# Patient Record
Sex: Female | Born: 1970 | Race: White | Hispanic: No | Marital: Single | Smoking: Former smoker
Health system: Southern US, Community
[De-identification: ages and names within clinical notes are randomized; demographics above are authoritative.]

## PROBLEM LIST (undated history)

## (undated) DIAGNOSIS — S83207A Unspecified tear of unspecified meniscus, current injury, left knee, initial encounter: Secondary | ICD-10-CM

## (undated) DIAGNOSIS — K449 Diaphragmatic hernia without obstruction or gangrene: Secondary | ICD-10-CM

## (undated) DIAGNOSIS — M1990 Unspecified osteoarthritis, unspecified site: Secondary | ICD-10-CM

## (undated) DIAGNOSIS — J189 Pneumonia, unspecified organism: Secondary | ICD-10-CM

## (undated) DIAGNOSIS — Z5189 Encounter for other specified aftercare: Secondary | ICD-10-CM

## (undated) DIAGNOSIS — E785 Hyperlipidemia, unspecified: Secondary | ICD-10-CM

## (undated) DIAGNOSIS — J45909 Unspecified asthma, uncomplicated: Secondary | ICD-10-CM

## (undated) DIAGNOSIS — Z973 Presence of spectacles and contact lenses: Secondary | ICD-10-CM

## (undated) DIAGNOSIS — F419 Anxiety disorder, unspecified: Secondary | ICD-10-CM

## (undated) DIAGNOSIS — N200 Calculus of kidney: Secondary | ICD-10-CM

## (undated) DIAGNOSIS — G4730 Sleep apnea, unspecified: Secondary | ICD-10-CM

## (undated) DIAGNOSIS — F32A Depression, unspecified: Secondary | ICD-10-CM

## (undated) DIAGNOSIS — Z86718 Personal history of other venous thrombosis and embolism: Secondary | ICD-10-CM

## (undated) DIAGNOSIS — Z972 Presence of dental prosthetic device (complete) (partial): Secondary | ICD-10-CM

## (undated) DIAGNOSIS — Z87442 Personal history of urinary calculi: Secondary | ICD-10-CM

## (undated) DIAGNOSIS — Z8489 Family history of other specified conditions: Secondary | ICD-10-CM

## (undated) DIAGNOSIS — Z7901 Long term (current) use of anticoagulants: Secondary | ICD-10-CM

## (undated) DIAGNOSIS — K219 Gastro-esophageal reflux disease without esophagitis: Secondary | ICD-10-CM

## (undated) DIAGNOSIS — I82409 Acute embolism and thrombosis of unspecified deep veins of unspecified lower extremity: Secondary | ICD-10-CM

## (undated) DIAGNOSIS — G4733 Obstructive sleep apnea (adult) (pediatric): Secondary | ICD-10-CM

## (undated) HISTORY — PX: COLONOSCOPY WITH ESOPHAGOGASTRODUODENOSCOPY (EGD): SHX5779

## (undated) HISTORY — PX: DILATION AND CURETTAGE OF UTERUS: SHX78

## (undated) HISTORY — PX: CYSTOSCOPY/RETROGRADE/URETEROSCOPY/STONE EXTRACTION WITH BASKET: SHX5317

## (undated) HISTORY — PX: FRACTURE SURGERY: SHX138

## (undated) HISTORY — PX: ABDOMINAL HYSTERECTOMY: SHX81

## (undated) HISTORY — PX: OTHER SURGICAL HISTORY: SHX169

## (undated) HISTORY — DX: Anxiety disorder, unspecified: F41.9

## (undated) HISTORY — DX: Gastro-esophageal reflux disease without esophagitis: K21.9

## (undated) HISTORY — PX: CYSTOSCOPY W/ URETERAL STENT PLACEMENT: SHX1429

## (undated) HISTORY — PX: AXILLARY SURGERY: SHX892

## (undated) HISTORY — PX: SHOULDER SURGERY: SHX246

## (undated) HISTORY — PX: TOTAL ABDOMINAL HYSTERECTOMY W/ BILATERAL SALPINGOOPHORECTOMY: SHX83

## (undated) HISTORY — DX: Hyperlipidemia, unspecified: E78.5

## (undated) HISTORY — PX: WRIST SURGERY: SHX841

## (undated) HISTORY — DX: Acute embolism and thrombosis of unspecified deep veins of unspecified lower extremity: I82.409

---
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
## 1998-05-18 ENCOUNTER — Emergency Department (HOSPITAL_COMMUNITY): Admission: EM | Admit: 1998-05-18 | Discharge: 1998-05-18 | Payer: Self-pay | Admitting: Emergency Medicine

## 1998-12-25 ENCOUNTER — Emergency Department (HOSPITAL_COMMUNITY): Admission: EM | Admit: 1998-12-25 | Discharge: 1998-12-25 | Payer: Self-pay | Admitting: Emergency Medicine

## 1998-12-26 ENCOUNTER — Encounter: Payer: Self-pay | Admitting: Emergency Medicine

## 1998-12-26 ENCOUNTER — Ambulatory Visit (HOSPITAL_COMMUNITY): Admission: RE | Admit: 1998-12-26 | Discharge: 1998-12-26 | Payer: Self-pay | Admitting: Emergency Medicine

## 1999-05-29 ENCOUNTER — Emergency Department (HOSPITAL_COMMUNITY): Admission: EM | Admit: 1999-05-29 | Discharge: 1999-05-29 | Payer: Self-pay

## 1999-12-30 ENCOUNTER — Emergency Department (HOSPITAL_COMMUNITY): Admission: EM | Admit: 1999-12-30 | Discharge: 1999-12-30 | Payer: Self-pay | Admitting: Emergency Medicine

## 1999-12-30 ENCOUNTER — Encounter: Payer: Self-pay | Admitting: Emergency Medicine

## 2000-01-02 ENCOUNTER — Encounter: Payer: Self-pay | Admitting: Urology

## 2000-01-02 ENCOUNTER — Ambulatory Visit (HOSPITAL_COMMUNITY): Admission: RE | Admit: 2000-01-02 | Discharge: 2000-01-02 | Payer: Self-pay | Admitting: Urology

## 2000-01-08 ENCOUNTER — Ambulatory Visit (HOSPITAL_COMMUNITY): Admission: RE | Admit: 2000-01-08 | Discharge: 2000-01-08 | Payer: Self-pay | Admitting: Urology

## 2000-01-08 ENCOUNTER — Encounter: Payer: Self-pay | Admitting: Urology

## 2000-03-07 ENCOUNTER — Emergency Department (HOSPITAL_COMMUNITY): Admission: EM | Admit: 2000-03-07 | Discharge: 2000-03-08 | Payer: Self-pay | Admitting: Emergency Medicine

## 2000-03-08 ENCOUNTER — Encounter: Payer: Self-pay | Admitting: Urology

## 2000-03-08 ENCOUNTER — Encounter: Payer: Self-pay | Admitting: Emergency Medicine

## 2000-03-08 ENCOUNTER — Encounter: Payer: Self-pay | Admitting: Anesthesiology

## 2000-03-08 ENCOUNTER — Ambulatory Visit (HOSPITAL_COMMUNITY): Admission: AD | Admit: 2000-03-08 | Discharge: 2000-03-08 | Payer: Self-pay | Admitting: Urology

## 2000-03-09 ENCOUNTER — Emergency Department (HOSPITAL_COMMUNITY): Admission: EM | Admit: 2000-03-09 | Discharge: 2000-03-09 | Payer: Self-pay

## 2000-06-11 ENCOUNTER — Ambulatory Visit (HOSPITAL_COMMUNITY): Admission: RE | Admit: 2000-06-11 | Discharge: 2000-06-11 | Payer: Self-pay | Admitting: Family Medicine

## 2000-06-11 ENCOUNTER — Encounter: Payer: Self-pay | Admitting: Family Medicine

## 2000-09-13 ENCOUNTER — Encounter (INDEPENDENT_AMBULATORY_CARE_PROVIDER_SITE_OTHER): Payer: Self-pay | Admitting: Specialist

## 2000-09-13 ENCOUNTER — Ambulatory Visit (HOSPITAL_COMMUNITY): Admission: RE | Admit: 2000-09-13 | Discharge: 2000-09-13 | Payer: Self-pay

## 2001-03-17 ENCOUNTER — Emergency Department (HOSPITAL_COMMUNITY): Admission: EM | Admit: 2001-03-17 | Discharge: 2001-03-17 | Payer: Self-pay | Admitting: Emergency Medicine

## 2001-12-05 ENCOUNTER — Emergency Department (HOSPITAL_COMMUNITY): Admission: EM | Admit: 2001-12-05 | Discharge: 2001-12-05 | Payer: Self-pay | Admitting: Emergency Medicine

## 2001-12-09 ENCOUNTER — Ambulatory Visit (HOSPITAL_COMMUNITY): Admission: RE | Admit: 2001-12-09 | Discharge: 2001-12-09 | Payer: Self-pay | Admitting: Urology

## 2001-12-11 ENCOUNTER — Encounter: Payer: Self-pay | Admitting: Urology

## 2001-12-11 ENCOUNTER — Emergency Department (HOSPITAL_COMMUNITY): Admission: EM | Admit: 2001-12-11 | Discharge: 2001-12-11 | Payer: Self-pay

## 2001-12-11 ENCOUNTER — Encounter: Payer: Self-pay | Admitting: Emergency Medicine

## 2001-12-11 ENCOUNTER — Ambulatory Visit (HOSPITAL_COMMUNITY): Admission: RE | Admit: 2001-12-11 | Discharge: 2001-12-11 | Payer: Self-pay | Admitting: Urology

## 2002-04-20 ENCOUNTER — Encounter: Payer: Self-pay | Admitting: Urology

## 2002-04-20 ENCOUNTER — Encounter: Admission: RE | Admit: 2002-04-20 | Discharge: 2002-04-20 | Payer: Self-pay | Admitting: Urology

## 2002-04-22 ENCOUNTER — Encounter: Payer: Self-pay | Admitting: Family Medicine

## 2002-04-22 ENCOUNTER — Ambulatory Visit (HOSPITAL_COMMUNITY): Admission: RE | Admit: 2002-04-22 | Discharge: 2002-04-22 | Payer: Self-pay | Admitting: Family Medicine

## 2002-05-27 ENCOUNTER — Other Ambulatory Visit: Admission: RE | Admit: 2002-05-27 | Discharge: 2002-05-27 | Payer: Self-pay | Admitting: Family Medicine

## 2002-07-13 ENCOUNTER — Ambulatory Visit (HOSPITAL_COMMUNITY): Admission: RE | Admit: 2002-07-13 | Discharge: 2002-07-13 | Payer: Self-pay | Admitting: Obstetrics & Gynecology

## 2002-12-23 ENCOUNTER — Emergency Department (HOSPITAL_COMMUNITY): Admission: EM | Admit: 2002-12-23 | Discharge: 2002-12-23 | Payer: Self-pay | Admitting: Emergency Medicine

## 2003-04-05 ENCOUNTER — Emergency Department (HOSPITAL_COMMUNITY): Admission: EM | Admit: 2003-04-05 | Discharge: 2003-04-05 | Payer: Self-pay | Admitting: Emergency Medicine

## 2003-04-05 ENCOUNTER — Encounter: Payer: Self-pay | Admitting: Emergency Medicine

## 2003-06-05 ENCOUNTER — Emergency Department (HOSPITAL_COMMUNITY): Admission: EM | Admit: 2003-06-05 | Discharge: 2003-06-06 | Payer: Self-pay | Admitting: Emergency Medicine

## 2003-06-11 ENCOUNTER — Encounter: Payer: Self-pay | Admitting: Family Medicine

## 2003-06-11 ENCOUNTER — Encounter: Admission: RE | Admit: 2003-06-11 | Discharge: 2003-06-11 | Payer: Self-pay | Admitting: Family Medicine

## 2003-06-23 ENCOUNTER — Encounter: Payer: Self-pay | Admitting: Family Medicine

## 2003-06-23 ENCOUNTER — Encounter: Admission: RE | Admit: 2003-06-23 | Discharge: 2003-06-23 | Payer: Self-pay | Admitting: Family Medicine

## 2003-08-11 ENCOUNTER — Ambulatory Visit (HOSPITAL_COMMUNITY): Admission: RE | Admit: 2003-08-11 | Discharge: 2003-08-11 | Payer: Self-pay | Admitting: Obstetrics and Gynecology

## 2003-08-11 ENCOUNTER — Encounter (INDEPENDENT_AMBULATORY_CARE_PROVIDER_SITE_OTHER): Payer: Self-pay

## 2003-10-26 ENCOUNTER — Encounter (INDEPENDENT_AMBULATORY_CARE_PROVIDER_SITE_OTHER): Payer: Self-pay | Admitting: Specialist

## 2003-10-26 ENCOUNTER — Inpatient Hospital Stay (HOSPITAL_COMMUNITY): Admission: RE | Admit: 2003-10-26 | Discharge: 2003-10-29 | Payer: Self-pay | Admitting: Obstetrics and Gynecology

## 2004-03-06 ENCOUNTER — Emergency Department (HOSPITAL_COMMUNITY): Admission: EM | Admit: 2004-03-06 | Discharge: 2004-03-06 | Payer: Self-pay | Admitting: Family Medicine

## 2005-03-09 ENCOUNTER — Encounter: Admission: RE | Admit: 2005-03-09 | Discharge: 2005-03-09 | Payer: Self-pay | Admitting: Family Medicine

## 2005-03-14 ENCOUNTER — Encounter: Admission: RE | Admit: 2005-03-14 | Discharge: 2005-03-14 | Payer: Self-pay | Admitting: Family Medicine

## 2005-05-28 IMAGING — US US ABDOMEN COMPLETE
1 series · 14 of 25 positions shown · non-contrast
Comparison: Abdomen CT dated 12/11/2001.

CLINICAL DATA: Right upper quadrant abdominal pain and tenderness.

COMPLETE ABDOMEN ULTRASOUND

[Series 1: unknown · 0.34mm/px · 14 of 65 slices shown]
[im 1/65]
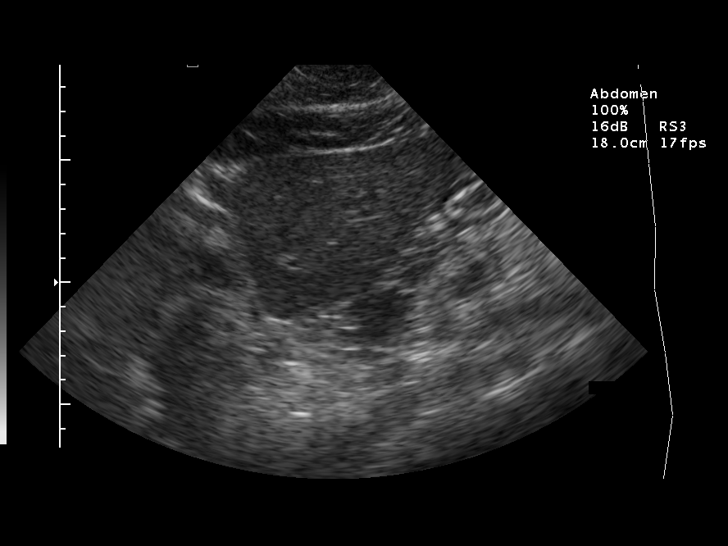
[im 6/65]
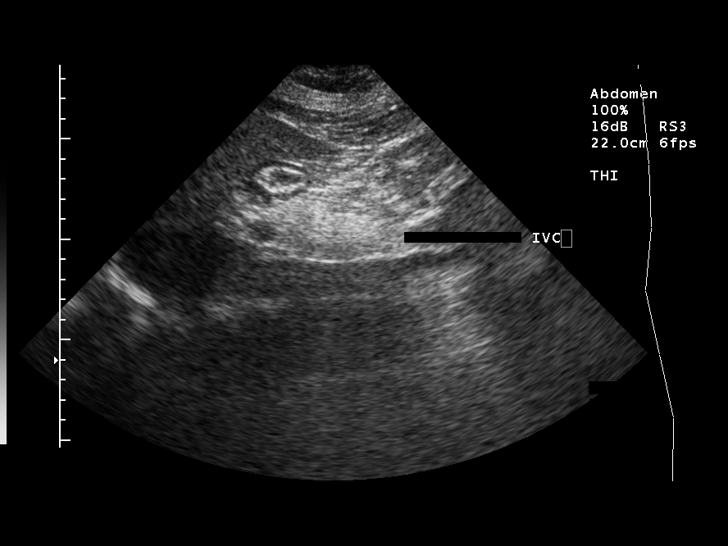
[im 11/65]
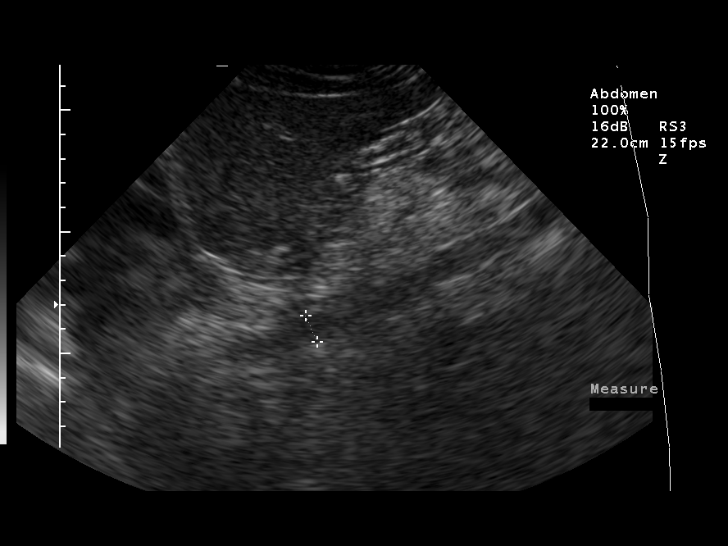
[im 17/65]
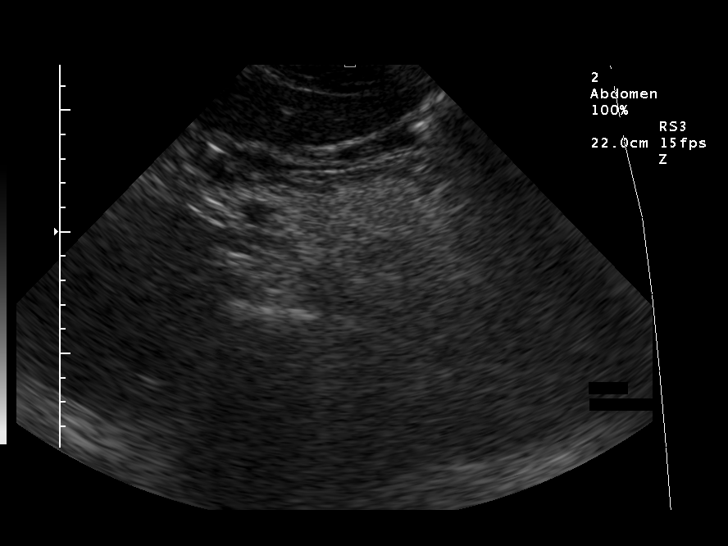
[im 22/65]
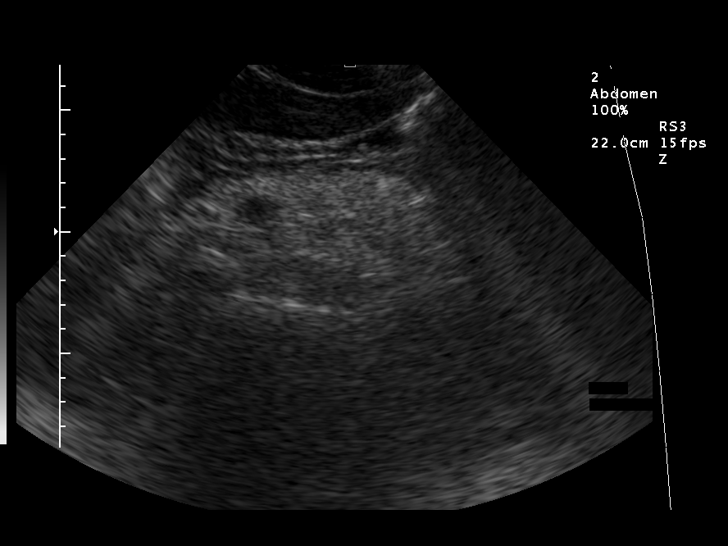
[im 25/65]
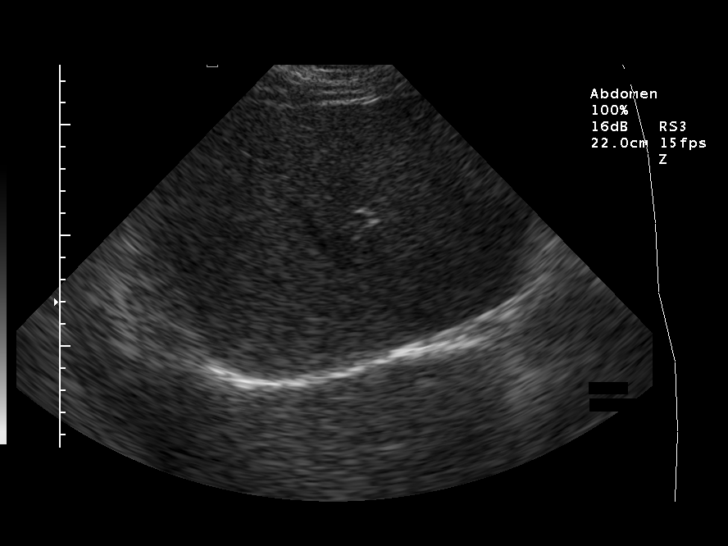
[im 30/65]
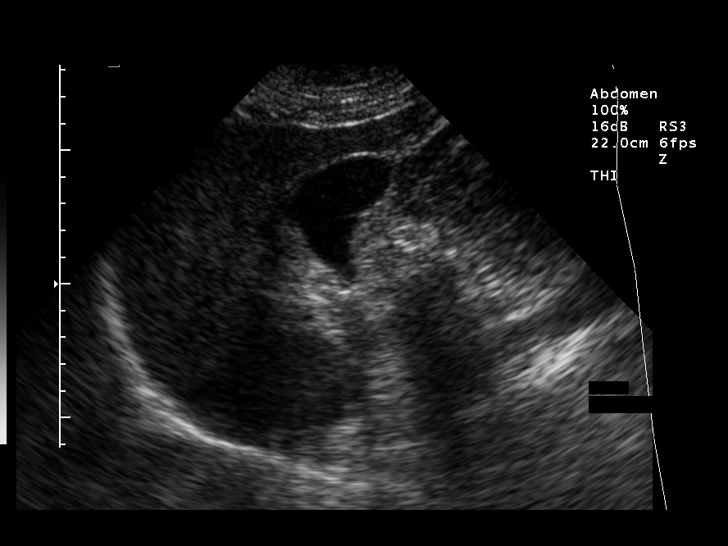
[im 35/65]
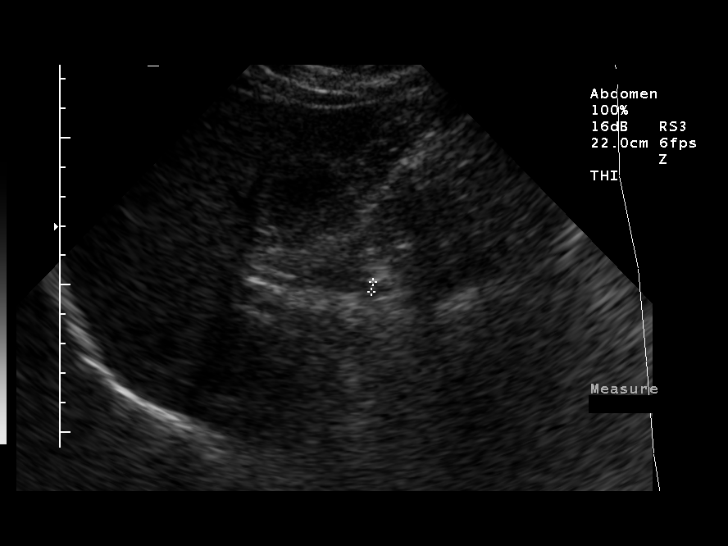
[im 41/65]
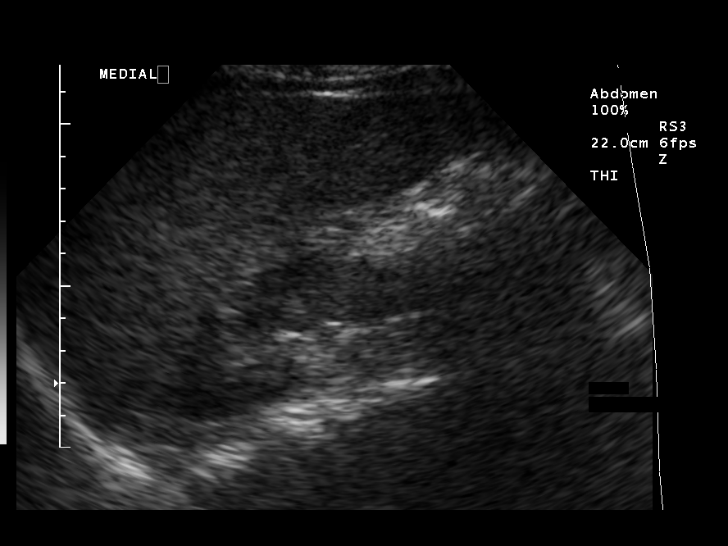
[im 43/65]
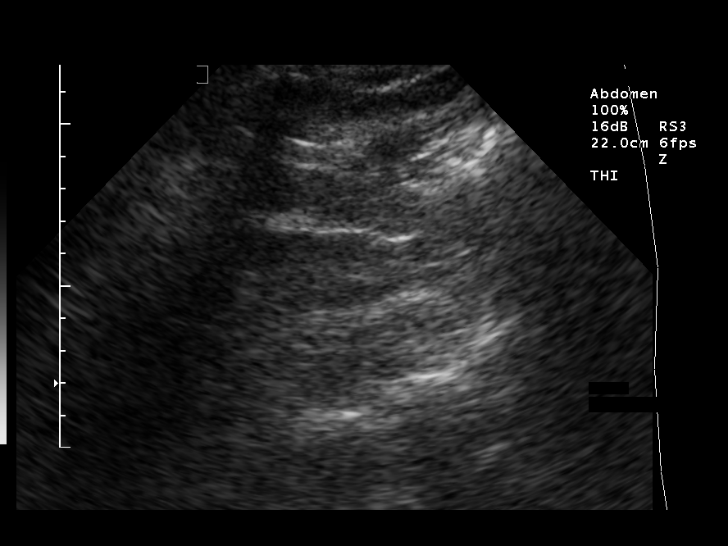
[im 49/65]
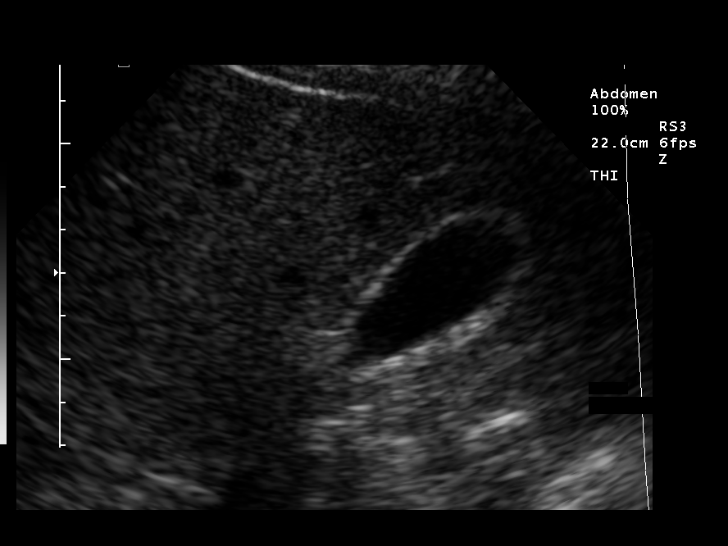
[im 54/65]
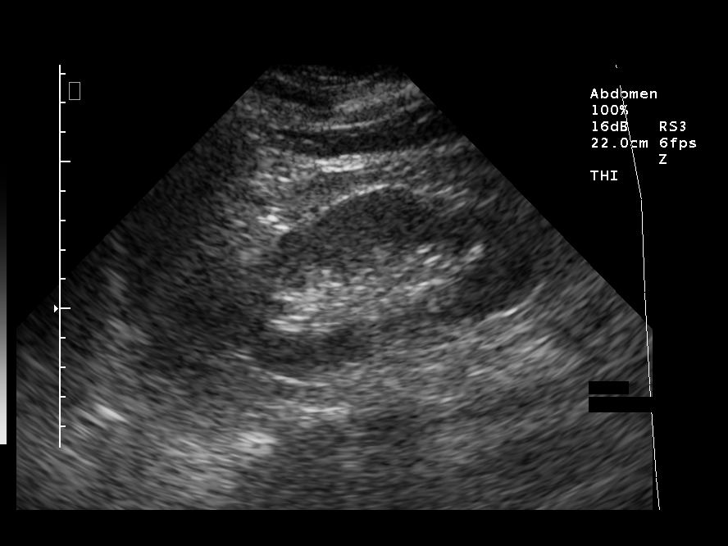
[im 59/65]
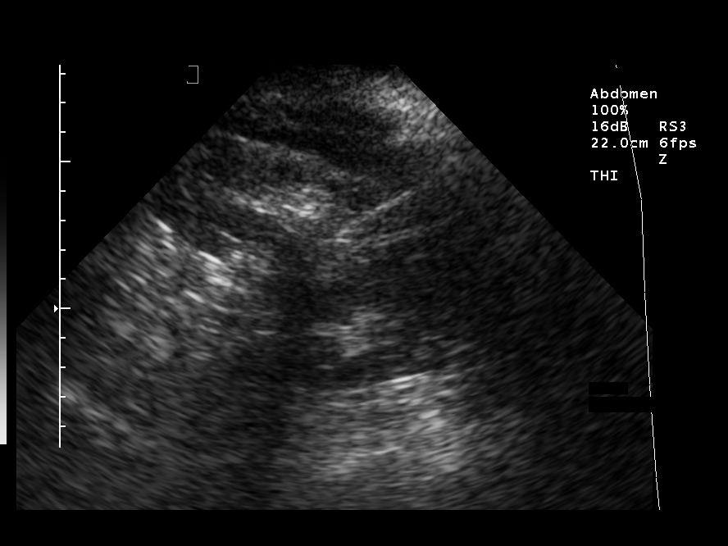
[im 65/65]
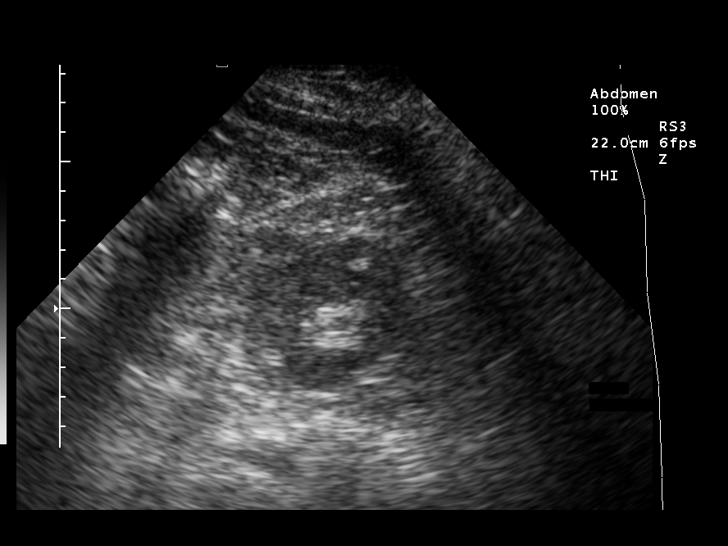

[14 of 25 positions shown; findings below may reference images not displayed]

The gallbladder, liver, spleen, pancreas, kidneys, abdominal aorta and inferior
vena cava have normal appearances. No gallstones, biliary ductal dilatation or
free peritoneal fluid.  The common duct measures 4.5 mm in maximum diameter.

IMPRESSION

Normal examination.

## 2005-09-11 ENCOUNTER — Encounter: Admission: RE | Admit: 2005-09-11 | Discharge: 2005-09-11 | Payer: Self-pay | Admitting: Obstetrics and Gynecology

## 2008-10-15 HISTORY — PX: SHOULDER ARTHROSCOPY: SHX128

## 2009-06-28 ENCOUNTER — Ambulatory Visit (HOSPITAL_BASED_OUTPATIENT_CLINIC_OR_DEPARTMENT_OTHER): Admission: RE | Admit: 2009-06-28 | Discharge: 2009-06-28 | Payer: Self-pay | Admitting: Family Medicine

## 2009-07-02 ENCOUNTER — Ambulatory Visit: Payer: Self-pay | Admitting: Internal Medicine

## 2010-10-30 ENCOUNTER — Emergency Department (HOSPITAL_COMMUNITY)
Admission: EM | Admit: 2010-10-30 | Discharge: 2010-10-31 | Payer: Self-pay | Source: Home / Self Care | Admitting: Emergency Medicine

## 2010-10-30 ENCOUNTER — Emergency Department (HOSPITAL_COMMUNITY)
Admission: EM | Admit: 2010-10-30 | Discharge: 2010-10-30 | Payer: Self-pay | Source: Home / Self Care | Admitting: Emergency Medicine

## 2010-10-30 ENCOUNTER — Encounter (INDEPENDENT_AMBULATORY_CARE_PROVIDER_SITE_OTHER): Payer: Self-pay | Admitting: Emergency Medicine

## 2010-11-01 LAB — POCT I-STAT, CHEM 8
BUN: 8 mg/dL (ref 6–23)
Calcium, Ion: 1.21 mmol/L (ref 1.12–1.32)
Chloride: 106 mEq/L (ref 96–112)
Creatinine, Ser: 0.9 mg/dL (ref 0.4–1.2)
Glucose, Bld: 98 mg/dL (ref 70–99)
HCT: 42 % (ref 36.0–46.0)
Hemoglobin: 14.3 g/dL (ref 12.0–15.0)
Potassium: 4.3 mEq/L (ref 3.5–5.1)
Sodium: 140 mEq/L (ref 135–145)
TCO2: 26 mmol/L (ref 0–100)

## 2010-11-15 ENCOUNTER — Other Ambulatory Visit: Payer: Self-pay | Admitting: Family Medicine

## 2010-11-15 DIAGNOSIS — Z1239 Encounter for other screening for malignant neoplasm of breast: Secondary | ICD-10-CM

## 2010-12-01 ENCOUNTER — Ambulatory Visit
Admission: RE | Admit: 2010-12-01 | Discharge: 2010-12-01 | Disposition: A | Discharge status: Home / Self Care | Payer: BC Managed Care – PPO | Source: Ambulatory Visit | Attending: Family Medicine | Admitting: Family Medicine

## 2010-12-01 DIAGNOSIS — Z1239 Encounter for other screening for malignant neoplasm of breast: Secondary | ICD-10-CM

## 2010-12-04 ENCOUNTER — Ambulatory Visit: Payer: Self-pay

## 2011-03-02 NOTE — Op Note (Signed)
**Note [name]-Identified via Obfuscation** [facility]  Patient:    [name], [name]                         MRN: [id number] Proc. Date: 09/13/00 Attending:  Zigmund Daniel, M.D.                           Operative Report  PREOPERATIVE DIAGNOSIS:  Hidradenitis suppurativa, left axilla.  POSTOPERATIVE DIAGNOSIS:  Hidradenitis suppurativa, left axilla.  OPERATION:  Excision of left axillary stem.  SURGEON:  Zigmund Daniel, M.D.  ANESTHESIA:  General.  DESCRIPTION OF PROCEDURE:  After the patient was adequately monitored and anesthetized, and after routine preparation and draping of the skin and surrounding areas of the left axilla and arm, I marked an elliptical skin including all of the noted chronic abscessed scars in the left axilla.  This amounted to an area of about 5 cm wide and 10 cm in length transversely across the axilla.  I incised the area with a scalpel and then achieved hemostasis with cautery. I incised the full thickness of skin and some underlying subcutaneous tissue, including all scars and chronic abscesses.  I then undermined the skin in all directions for several centimeters so as to provide relaxation for closure.  I got ______ spaced.  I then placed a 7 mm Jackson-Pratt drain through a separate stab incision, secured it with 2-0 silk.  I closed the wound using interrupted 3-0 Vicryl sutures for the subcutaneous tissues.  Staples for the skin.  I applied a bulky, somewhat compressive bandage.  The patient tolerated the operation well. DD:  09/13/00 TD:  09/13/00 Job: [id number]

## 2011-03-02 NOTE — Op Note (Signed)
[name], [name]                          ACCOUNT NO.:  [id number]   MEDICAL RECORD NO.:  [id number]                   PATIENT TYPE:  INP   LOCATION:  9302                                 FACILITY:  [facility]   PHYSICIAN:  [name], M.D.                DATE OF BIRTH:  28-Sep-1971   DATE OF PROCEDURE:  10/26/2003  DATE OF DISCHARGE:                                 OPERATIVE REPORT   PREOPERATIVE DIAGNOSES:  1. Complex endometrial hyperplasia.  2. Dysmenorrhea.  3. Pelvic pain.   POSTOPERATIVE DIAGNOSES:  1. Complex endometrial hyperplasia.  2. Dysmenorrhea.  3. Pelvic pain.   PROCEDURE:  Total abdominal hysterectomy and bilateral salpingo-  oophorectomy.   SURGEON:  [name], M.D.   ASSISTANT:  [name]. [name], M.D.   ANESTHESIA:  General endotracheal anesthesia.   COMPLICATIONS:  None.   ESTIMATED BLOOD LOSS:  150 mL.   URINE OUTPUT:  100 mL clear urine.   FINDINGS:  Marked centripetal obesity, normal-appearing uterus, tubes and  ovaries with adhesions of the left ovary and fallopian tube to the pelvic  sidewall and adhesions of the colon and reactive to the left ovary.   INDICATIONS FOR PROCEDURE:  This is a 40 year old gravida 0 white female who  was recently diagnosed with complex endometrial hyperplasia without atypia  on D&C performed in October of 2004.  At the same time, she also underwent  diagnostic laparoscopy for chronic pelvic pain and at that time was noted to  have some pelvic sidewall adhesions of the left ovary which were difficult  to take down secondary to their dense nature.  The patient has a long  history of oligomenorrhea and has had a normal prolactin, TSH and glucose  insulin ratio.  She has been on cyclic progestin since her surgery in  October but desires definitive treatment with hysterectomy for complex  hyperplasia as she is unable to tolerate progestin secondary to the side  effects and she desires no childbearing in the  future.  Given her history of  pelvic pain, she also desires BSO.  She has been taking ibuprofen and  narcotics to control her dysmenorrhea.  Prior to the procedure, the risks of  the procedure were reviewed with the patient and informed consent were  obtained.  Discussed the risks of hemorrhage requiring transfusion,  infection requiring prolonged hospitalization, injury to bowel, bladder, or  other intra-abdominal organs requiring further surgery, possibility of  fistula formation, anesthetic related complications including death, deep  venous thrombosis, and we discussed the need for hormone replacement therapy  given that she will be without any ovarian hormone production.  I also  discussed the risk of decreased sexual function secondary to lack of ovarian  estrogen and __________ production.  The patient voiced understanding of all  these risks and agrees to proceed.  Consent was obtained prior to proceeding  to the OR.   DESCRIPTION  OF PROCEDURE:  The patient was taken to the operating room and  given general anesthesia, prepped and draped in the usual sterile fashion  and placed in the supine position.  A Foley catheter was placed.  A  Pfannenstiel skin incision was made with the scalpel.  This was carried down  sharply to the fascia.  The subcutaneous fat was noted to be approximately 4  cm in depth.  The fascia was then incised in the midline and the fascial  incision was extended laterally with the Mayo scissors.  The superior and  inferior aspects of the fascial incision were grasped with Kocher clamps,  elevated and the underlying rectus muscles dissected off with sharp and  blunt dissection.  The muscles were then severed in the midline.  The  peritoneum was identified, tented up with the hemostats and then sharply  with Metzenbaum scissors.  This incision was extended superiorly and  inferiorly with good visualization of the bladder.  At this point, the  Balfour retractor  was placed and there was very little mobility of the  rectus muscles. We were unable to obtain adequate visualization, therefore,  the retractor was removed.  The skin incision was then extended on both  sides and the fascial incision was extended as well.  Again, there was great  difficulty in separating the rectus muscles, secondary to both intra-  abdominal fat and the size of the muscles, therefore, the rectus muscles  were then transected approximately 1 cm laterally on both sides, free from  the inferior epigastric vessels and after this was done, we were able to  place the Balfour retractor and adequate retract the muscles laterally.  At  this point, the bowel was packed away with moist laparotomy sponges.  There  were adhesions noted of the colon to the left fallopian tube and ovary.  These were taken down with very sharp meticulous dissection with Metzenbaum  scissors until the bowel was able to be packed away adequately.  At this  point, two curved Kelly clamps were then placed on the cornua and used for  retraction.  The round ligament on the left side was then suture ligated and  transected and hemostasis was visualized.  The broad ligament was then  opened up and careful blunt dissection was used to identify the ureter.  The  ureter was noted to be coursing well beneath the infundibulopelvic ligament;  however, the ovary was stuck to the back side of the broad ligament as well  as part of the uterus.  The ovary was then carefully taken off the uterus  and the broad ligament with sharp dissection.  The infundibulopelvic  ligament was then ligated well above the area of the ureter.  The utero-  ovarian ligament was then ligated as well as it was very difficult to see  secondary to the patient's size.  Once the utero-ovarian and the  infundibulopelvic ligaments were ligated, the ovary and tube were removed to facilitate further visualization.  At this point, attention was then  turned  to the patient's right side where the same procedure was carried out in a  similar fashion.  Again, the round ligament was transected and ligated.  The  broad ligament was opened up and the ureter was found coursing well beneath  the infundibulopelvic ligament.  The infundibulopelvic ligament was then  transected and ligated with LigaSure.  The utero-ovarian ligament was then  ligated with the LigaSure and the ovary and tube were removed.  At this  point, the bladder flap was created using sharp dissection.  There was a  great amount of preperitoneal fat over the bladder and it was very difficult  to visualize.  The bladder was taken down meticulously with both sharp and  blunt dissection.  Once this was achieved, the uterine arteries were then  skeletonized bilaterally.  The uterine arteries were then ligated and  transected and were hemostatic.  At this point, the cardinal ligaments were  then ligated and transected as well and the site was hemostatic.  At this  point, the uterosacral ligaments were palpated.  They were then clamped with  Heaney clamps, transected and suture ligated and were hemostatic.  At this  point, the vaginal cuff angles were grasped with Heaney clamps just beneath  the palpable level of the cervix.  The cuff angle was then transected just  beneath the cervix and was secured with a figure-of-eight suture of 0  Vicryl.  At this point, the uterus and cervix were amputated with the curved  hysterectomy scissors.  The edges of the vaginal cuff were grasped with  Allis clamps.  There was some bleeding noted from the left angle.  This was  carefully secured and ligated with a Vicryl suture and was made hemostatic.  At this point, the vaginal cuff was closed using figure-of-eight sutures of  Vicryl and was hemostatic.  At this point, all pedicles were carefully  inspected.  There was no evidence of any bleeding noted.  The pelvis was  then irrigated copiously with  warm normal saline. After irrigation was  completed, pedicles were again reinspected and there was no active bleeding  noted.  The area beneath the bladder was inspected.  There was no bleeding  noted.  At this point, all laparotomy sponges and instruments were removed  from the patient's abdomen.  The bowel was unpacked.  The Balfour retractor  was removed.  The bowel was carefully inspected.  There was no evidence of  any undue trauma to the bowel.  The muscles that had been transected  previously were carefully inspected.  There was no active bleeding noted.  All sponge, lap, instrument and needle counts correct at this point and the  fascia was closed with a running PDS suture.  Great care was taken to insure  that both layers of the fascia were incorporated into the fascial suture at  closure and once closed, the fascia was hemostatic.  At this point, the  incision was then again irrigated. There was no evidence of any active bleeding in the subcutaneous space, given the significant depth of the  subcutaneous space,  four interrupted Vicryl sutures were placed to  facilitate wound closure followed by closure of the skin with staples.   The patient tolerated the procedure well.  Sponge, lap, instrument and  needle counts correct x 2.  She was taken to the recovery room awake and in  stable condition.  There were no complications.   Total time for procedure was two and a half hours secondary to patient's  marked obesity.                                               [name], M.D.    JW/MEDQ  D:  10/26/2003  T:  10/26/2003  Job:  [id number]

## 2011-03-02 NOTE — Op Note (Signed)
NAMEFOREST, PRUDEN                          ACCOUNT NO.:  [id number]   MEDICAL RECORD NO.:  [id number]                   PATIENT TYPE:  AMB   LOCATION:  SDC                                  FACILITY:  [facility]   PHYSICIAN:  Richardean Sale, M.D.                DATE OF BIRTH:  1970/12/26   DATE OF PROCEDURE:  08/11/2003  DATE OF DISCHARGE:                                 OPERATIVE REPORT   PREOPERATIVE DIAGNOSES:  1. Pelvic pain.  2. Oligomenorrhea with thickened endometrium on ultrasound.   POSTOPERATIVE DIAGNOSES:  1. Pelvic pain.  2. Oligomenorrhea with thickened endometrium on ultrasound.   PROCEDURE:  Dilatation and curettage and laparoscopy with lysis of  adhesions.   SURGEON:  Richardean Sale, M.D.   ASSISTANT:  Lenoard Aden, M.D.   ANESTHESIA:  General.   COMPLICATIONS:  None.   ESTIMATED BLOOD LOSS:  Minimal.   FINDINGS:  Normal-appearing cervix, uterus, fallopian tubes and ovaries with  adhesions of the left ovary to the pelvic sidewall; normal-appearing  appendix; normal-appearing liver; no evidence of endometriosis.   INDICATION:  This is a 40 year old gravida 0 white female with a two-month  history of pelvic pain, predominantly left-sided, who has a history of  endometriosis on laparoscopy one year ago.  The patient had undergone an  ultrasound to evaluate her pelvic pain, at which time she was noted to have  a prominent endometrial stripe with an inhomogeneous appearance.  The  patient does have a history of oligomenorrhea with occasional heavy menses.  The patient has declined empiric treatment with either oral contraceptives  or Lupron.   PROCEDURE:  After the risks and benefits of the procedure were reviewed with  the patient and informed consent was obtained, she was then taken to the  operating room where she was given general anesthesia and was prepped and  draped in the usual sterile fashion in the dorsal lithotomy position.  Bimanual exam  revealed an anteverted uterus which was small, mobile and  nontender.  A sterile speculum was placed in the vagina.  The cervix was  grasped with single-tooth tenaculum and the cervix was then dilated with the  [facility] dilators.  A sharp D&C was performed with a moderate amount of tissue  removed and this was sent to pathology.  The Hulka tenaculum was then placed  into the cervix and the speculum and single-tooth tenaculum were removed.  Attention was then turned to the patient's abdomen, where a 10-mm skin  incision was then made just beneath the umbilicus.  This was carried down  sharply to the fascia.  The fascia was identified and entered sharply with  the Mayo scissors.  This incision was then extended laterally and the  fascial incision was secured with a pursestring Vicryl suture.  The balloon  Hasson trocar was then introduced through the incision, the balloon was  inflated and CO2 gas was  allowed to insufflate the abdomen.  The camera was  then introduced and confirmed placement in the abdomen.  The omentum was  inspected and there was no evidence of any injury.  The bowel was inspected;  there was no evidence of any injury with trocar insertion.  The pelvis was  then inspected and a second 5-mm skin incision was then made 2 cm above the  symphysis pubis in the midline.  Through this, the 5-mm trocar and sleeve  were introduced under direct visualization.  A blunt probe was then used to  retract the bowel and the pelvis was examined with the findings noted above.  There were some adhesions involving the left ovary to the sidewall,  specifically over the area of the ureter.  The ureter was identified and  seemed to be coursing just beneath the ovary.  There was a small plane of  adhesions that were not directly over the ureter and therefore, a third skin  incision was then made in the patient's left lower quadrant, free from any  vessels, and the 5-mm trocar and sleeve were introduced  directly.  An  atraumatic grasper was then used to position the ovary and the laparoscopy  scissors were then used to carefully lyse these adhesions, keeping well-away  from the ureter.  Once adhesiolysis had been performed, the area was  inspected and there was no evidence of any bleeding from the sidewall; there  was, however, a scant bit of bleeding from the surface of the ovary  secondary to manipulation of the ovary; this was cauterized with the  Kleppinger cautery and was hemostatic.  The pelvis was then irrigated with  saline and a syringe was used to remove any fluid.  All surgical sites were  then inspected and were hemostatic.  At this point, the instruments were  removed from the patient's abdomen; first the 5-mm trocars were removed  under direct visualization and CO2 gas was allowed to escape.  There was no  evidence of any bleeding from the incision sites.  Once the remaining  portion of CO2 gas had been released from the abdomen, the balloon Hasson  was removed.  A finger was placed into the fascial incision; there was no  evidence of any bowel in the fascial site and the fascial incision was  closed with the already-placed pursestring suture.  A second Vicryl suture  was then used to close the remaining subcutaneous space, followed by closure  of the skin with a 4-0 Vicryl suture.  The 5-mm sites were closed with  Dermabond.  The Hulka tenaculum was removed from the patient's cervix; there  was minimal bleeding noted from the cervical os.  The patient was taken out  of the dorsal lithotomy position and all sponge, lap, needle and instrument  counts were correct x2.  She was awakened from anesthesia and taken to  recovery room in good condition.                                               Richardean Sale, M.D.    JW/MEDQ  D:  08/11/2003  T:  08/11/2003  Job:  [id number]

## 2011-03-02 NOTE — Op Note (Signed)
[facility]  Patient:    [name], [name]                       MRN: [id number] Proc. Date: 03/08/00 Adm. Date:  [id number] Disc. Date: [id number] Attending:  Earline Mayotte R                           Operative Report  PREOPERATIVE DIAGNOSIS:  Right ureteral stone.  POSTOPERATIVE DIAGNOSIS:  Right ureteral stone.  PROCEDURE:  Cystoscopy, ureteroscopy, with stone extraction and insertion of double J catheter.  SURGEON:  Lindaann Slough, M.D.  ANESTHESIA:  General.  INDICATION:  The patient is a 40 year old female who has been complaining of right flank pain on and off for the past two months.  An IVP during March showed a right ureteral stone.  A repeat KUB showed no definite evidence of a stone.  The patient was doing well until yesterday, when she started having severe right flank and right lower quadrant pain.  She was seen in the emergency room, and a CT scan showed a 5 mm stone in the right distal ureter with hydronephrosis.  The patient is still complaining of right lower quadrant pain.  She is scheduled now for cystoscopy and stone manipulation.  DESCRIPTION OF PROCEDURE:  Under general anesthesia, the patient was prepped and draped in the placed in the dorsal lithotomy position.  A #22 Wappler cystoscope was inserted in the bladder.  The bladder mucosa is normal.  There is no stone or tumor in the bladder.  The ureteral orifices are in normal position and shape.  A guidewire was then passed through the cystoscope into the right ureter.  The cystoscope was then removed.  The guidewire was used as assisting wire.  A #6.5 French rigid ureteroscope was then passed in the bladder into the right ureter.  A stone is visualized in the distal ureter.  A stone basket was then passed through the ureteroscope and then the stone was extracted and retrieved.  The ureteroscope was then reinserted in the ureter. There was no evidence of ureteral injury, and there  was no evidence of other stone in the ureter.  The ureteroscope was then removed.  The guidewire was then back-loaded into the cystoscope, and a #6 French-26 double J catheter was passed over the guidewire.  The string was left at the distal end of the double J catheter.  The bladder was then emptied, and the cystoscope and guidewire were removed.  The patient tolerated the procedure well and left the OR in satisfactory condition to postanesthesia care unit. DD:  03/08/00 TD:  03/13/00 Job: [id number]

## 2011-03-02 NOTE — Op Note (Signed)
[name], [name]                          ACCOUNT NO.:  [id number]   MEDICAL RECORD NO.:  [id number]                   PATIENT TYPE:  AMB   LOCATION:  SDC                                  FACILITY:  [facility]   PHYSICIAN:  Freddy Finner, M.D.                DATE OF BIRTH:  1971/01/18   DATE OF PROCEDURE:  07/13/2002  DATE OF DISCHARGE:                                 OPERATIVE REPORT   PREOPERATIVE DIAGNOSIS:  1. Hemorrhagic right ovarian cyst.  2. Chronic pelvic pain.  3. Dysfunctional uterine bleeding unresponsive to oral contraceptives.   POSTOPERATIVE DIAGNOSES:  1. Hemorrhagic cyst of the left ovary.  2. Left ovarian adhesions.  3. Normal appearing right ovary with recent evidence of corpus luteum.   OPERATIVE PROCEDURE:  1. Laparoscopy.  2. Lysis of adhesions.  3. Uterosacral nerve ablation.  4. Drainage of hemorrhagic left ovarian cyst.   SECONDARY DIAGNOSIS:  Possible boggy enlargement of uterus with no definite  nodules.   ANESTHESIA:  General.   INTRAOPERATIVE COMPLICATIONS:  None.   ESTIMATED INTRAOPERATIVE BLOOD LOSS:  Less than 10 cc.   PROCEDURE:  The patient is a 40 year old with dysfunctional uterine bleeding  and a recent ovarian cyst with hemorrhage which was felt to be related to  her heavy vaginal bleeding.  Oral contraceptives failed to adequately  control the bleeding and she has continue to have pain in spite of what  seems to be resolution of the cyst.  She is admitted now for laparoscopy.   She is admitted on the morning of surgery and brought to the operating room.  There she was placed under adequate general anesthesia and placed in the  dorsal lithotomy position.  Betadine prep of the abdomen, perineum and  vagina was carried out in the usual fashion.  Sterile drapes were applied  after the bladder was evacuated with a Robinson catheter and Hulka tenaculum  was passed to the cervix.  Two small incisions were made; one at the  umbilicus and  one just above the symphysis.  An 11-mm disposable trocar was  introduced at the umbilicus while elevating the anterior abdominal wall.  Direct inspection revealed adequate placement with no evidence of injury on  entry.  A 5-mm trocar was placed in the lower incision under direct  visualization.  Systematic examination of the pelvic contents and the  abdominal contents was carried out with no abnormal findings except as those  noted in the postoperative diagnoses.  The appendix was visualized and it  was normal.  There was no apparent abnormality in the upper abdomen.  There  were no definite peritoneal lesions consistent with endometriosis, although  the left ovary was firmly fixed to the left sidewall and with careful blunt  dissection this was elevated again with findings of adhesions but no  definite endometriosis.  The right ovary at this point in time seemed to  be  normal.  There was evidence of a recent corpus luteum.   The bipolar forceps were used to fulgurate the torn attachment of the  adhesions on the left pelvic sidewall on the lateral side of the ovary.  Attempts were made to aspirate from the cyst on the left with success but  there was some drainage from the hemorrhagic cyst on the lateral side of the  ovary needle puncture.  This area was fulgurated with the bipolar forceps  also.  The uterus itself was boggy and irregular.  For that reason  uterosacral nerves were ablated using the bipolar device and grasping the  uterosacral ligaments at their junction of the cervix.   After completing the procedure the instruments were removed.  Hemostasis was  complete.  The skin incisions were closed with subcuticular sutures of 3-0  Dexon at the umbilicus and Steri-Strips at the lower incision.  A 0.25%  plain Marcaine was injected through the incision for postoperative  analgesia.  The patient tolerated the procedure well was awakened and taken  to recovery in good  condition.                                               Freddy Finner, M.D.    WRN/MEDQ  D:  07/13/2002  T:  07/13/2002  Job:  [id number]

## 2011-03-02 NOTE — H&P (Signed)
NAMETALINE, NASS                          ACCOUNT NO.:  [id number]   MEDICAL RECORD NO.:  [id number]                   PATIENT TYPE:   LOCATION:                                       FACILITY:  [facility]   PHYSICIAN:  Richardean Sale, M.D.                DATE OF BIRTH:  [birth year]   DATE OF ADMISSION:  10/20/2003  DATE OF DISCHARGE:                                HISTORY & PHYSICAL   PREOPERATIVE DIAGNOSES:  1. Complex endometrial hyperplasia.  2. Dysmenorrhea.  3. Pelvic pain.   HISTORY OF PRESENT ILLNESS:  This is a 40 year old gravida 0 white female  recently diagnosed with complex endometrial hyperplasia without atypia on  D&C performed in October of 2004, also diagnosed with left pelvic sidewall  and ovarian adhesions on laparoscopy in October 2004.  The patient has a  long history of oligoamenorrhea, has had a normal prolactin and TSH and  glucose insulin ratio.  She has been on cyclic progestins since her surgery  in October but desires definitive treatment for her complex hyperplasia as  she desires no childbearing in the future.  She also has a history of  dysmenorrhea which requires the use of ibuprofen and narcotics to control  her menstrual cramps.  She also had significant dyspareunia which is  prohibiting her from any sexual activity.  She presents for definitive  surgical management with hysterectomy.   MEDICATION:  Vicodin p.r.n.   ALLERGIES:  TYLENOL NO. 3 causes nausea and vomiting.   PAST MEDICAL HISTORY:  Kidney stones.   PAST SURGICAL HISTORY:  1. October 2003 she had a laparoscopic ovarian cystectomy and fulguration of     endometriosis.  2. October of 2004, laparoscopy with lysis of adhesions of the left ovary as     well as D&C for thickened endometrium revealing complex endometrial     hyperplasia.  3. She has also had glands removed under her left axilla secondary to cysts.   PAST GYNECOLOGIC HISTORY:  No abnormal Pap smears.  No STDs.   OBSTETRIC  HISTORY:  Gravida 0.   SOCIAL HISTORY:  Positive tobacco at one pack per day.  Denies any drug  abuse.  She drinks alcohol only socially.  She is currently in a lesbian  relationship.   FAMILY HISTORY:  Denies any ovarian, uterine, breast, or colon cancer.  Mother had a hysterectomy at age 84 for chronic pelvic pain.   PHYSICAL EXAMINATION:  GENERAL APPEARANCE:  She is a well-developed, well-  nourished white female in no apparent distress.  VITAL SIGNS:  She is afebrile.  Her vital signs are stable.  NECK:  Thyroid is within normal limits.  No masses appreciated.  LUNGS:  Clear to auscultation bilaterally.  CARDIOVASCULAR:  Regular rate and rhythm with no murmurs, rubs, or gallops.  ABDOMEN:  There is marked central obesity.  Umbilical laparoscopic incision  has healed.  There is no  hernia.  Liver and spleen are normal.  No palpable  masses.  PELVIC:  External genitalia within normal limits.  Cervix is mobile in the  midline with no cervical motion tenderness.  Uterus anteverted, nontender,  no masses, mild tenderness to palpation over both adnexa.  No masses noted.  EXTREMITIES:  No clubbing, cyanosis, or edema.   ASSESSMENT:  A 40 year old gravida 0 white female with:  1. Complex endometrial hyperplasia.  2. Dysmenorrhea.  3. Dyspareunia and pelvic pain.   PLAN:  We will proceed with total abdominal hysterectomy and bilateral  salpingo-oophorectomy.  I have discussed the option of leaving one ovary  behind with the patient in order to avoid surgical menopause but the patient  would like to have both ovaries removed as she desires definitive therapy.  I have reviewed with the patient that surgery may not cure her pain entirely  and she states she is aware of this.  I have also reviewed the need for  estrogen replacement therapy as she will be surgical menopause and reviewed  the risk of osteoporosis, vaginal dryness, hot flashes and decreased libido  with the patient.  We have  also discussed the risks of hemorrhage requiring  transfusion, infection requiring prolonged hospitalization or antibiotics,  injury to the bowel, bladder or other intra-abdominal organs requiring  further surgery, anesthetic related complications including death and DVT.  The patient voices understanding of all of these risks and agrees to  proceed.  We will proceed with total abdominal hysterectomy and bilateral  salpingo-oophorectomy on October 25, 2002.                                               Richardean Sale, M.D.    JW/MEDQ  D:  10/20/2003  T:  10/20/2003  Job:  [id number]

## 2011-03-02 NOTE — Discharge Summary (Signed)
[name], [name]                          ACCOUNT NO.:  [id number]   MEDICAL RECORD NO.:  [id number]                   PATIENT TYPE:  INP   LOCATION:  9302                                 FACILITY:  [facility]   PHYSICIAN:  Richardean Sale, M.D.                DATE OF BIRTH:  May 21, 1971   DATE OF ADMISSION:  10/26/2003  DATE OF DISCHARGE:  10/29/2003                                 DISCHARGE SUMMARY   ADMITTING DIAGNOSES:  1. Complex endometrial hyperplasia.  2. Dysmenorrhea.  3. Pelvic pain.   DISCHARGE DIAGNOSES:  1. Complex endometrial hyperplasia.  2. Dysmenorrhea.  3. Pelvic pain.  4. Status post total abdominal hysterectomy and bilateral salpingo-     oophorectomy.   PROCEDURES:  Total abdominal hysterectomy and bilateral salpingo-  oophorectomy performed on October 26, 2003 without complication.   HOSPITAL COURSE AND HISTORY OF PRESENT ILLNESS:  This is a 40 year old  gravida 0 white female who was diagnosed with complex endometrial  hyperplasia without atypia on dilation and curettage performed in October  2004.  At that time she also underwent a diagnostic laparoscopy for chronic  pelvic pain and was found to have pelvic sidewall adhesions of the left  ovary.  The patient had been treated with cyclic progestin since October  2004 but desires definitive therapy for her complex endometrial hyperplasia  and pelvic pain.  The patient also desired BSO given her pelvic pain and her  desire to have no children in the future.  She presented on October 26, 2003  for a total abdominal hysterectomy and bilateral salpingo-oophorectomy.  Please see History and Physical and operative report for surgical consent.   After total abdominal hysterectomy and bilateral salpingo-oophorectomy were  performed on October 26, 2003 the patient had a relatively uncomplicated  postoperative course.  The patient did have some mild tachycardia.  Evaluation revealed a normal hemoglobin, a low-grade  temperature - maximum  of 100.  During her postoperative course her tachycardia resolved and the  patient did have some intermittent low oxygen saturations which were  consistent with atelectasis and poor inspiratory effort as the patient was a  smoker.  She was given aggressive care with incentive spirometry and was  symptomatically much improved with improvement of her oxygen saturations to  normal by postoperative day #3.  Her postoperative ileus resolved on  postoperative day #3.  Her incision was very mildly erythematous, and given  her low-grade fever she was started on Keflex for 10 days after discharge as  well as she received an additional postoperative dose of IV cephalosporin.  On postoperative day #3 the patient was tolerating a regular diet, her pain  was controlled with oral pain medications, she was ambulating and voiding  without difficulty, and her shortness of breath and tachycardia had  resolved.  The patient was placed on DVT prophylaxis with sequential  compression devices during her postoperative course and there  was no  evidence of DVT throughout her whole hospitalization.  The patient was  subsequently discharged to home on postoperative day #3 in good condition.   SIGNIFICANT LABORATORY FINDINGS:  Postoperative CBC:  Hemoglobin was 13.6;  hematocrit 39.0; white blood cell count 12.9; platelet count 261,000.   DISPOSITION:  To home.   CONDITION:  Good.   FOLLOW-UP:  The patient will follow up in 3 days for an incision check as  well as in 4 weeks for a routine postoperative visit.   MEDICATIONS:  1. Keflex 500 mg p.o. q.i.d. x10 days.  2. Percocet one to two tablets p.o. q.4-6h. p.r.n. pain.  3. Motrin 800 mg p.o. q.8h. p.r.n. pain.   INSTRUCTIONS:  Routine postoperative instructions were reviewed with the  patient.  She was to call for fever greater than 101, increasing drainage  from her incision or increasing erythema, pain not controlled with oral pain   medications, or any other unusual symptoms.                                               Richardean Sale, M.D.    JW/MEDQ  D:  12/08/2003  T:  12/08/2003  Job:  [id number]

## 2011-03-02 NOTE — Op Note (Signed)
[facility]  Patient:    [name], [name] Visit Number: [id number] MRN: [id number]          Service Type: DSU Location: DAY Attending Physician:  Lindaann Slough Dictated by:   Lindaann Slough, M.D. Proc. Date: 12/09/01 Admit Date:  12/09/2001                             Operative Report  PREOPERATIVE DIAGNOSIS:  Right ureteral stone.  POSTOPERATIVE DIAGNOSIS:  No ureteral stone.  PROCEDURE DONE: 1. Cystoscopy. 2. Right retrograde pyelogram. 3. Ureteroscopy. 4. Insertion of double-J catheter.  SURGEON:  Lindaann Slough, M.D.  ANESTHESIA:  General.  INDICATION:  The patient is a 40 year old female, who was seen in the emergency room four days ago, complaining of severe right flank pain.  A CT scan showed a 6 mm stone in the right distal ureter and a 1-2 mm stone in the left kidney.  The patient has continued to complain of pain.  She is scheduled today for cystoscopy and stone manipulation.  DESCRIPTION OF PROCEDURE:  Under general anesthesia, the patient was prepped and draped and placed in the dorsal lithotomy position.  A #21 Wappler cystoscope was inserted in the bladder.  The bladder mucosa is normal.  There is no stone or tumor in the bladder.  The ureteral orifices are in normal position and shape with clear efflux.  A cone tip catheter was then passed up through the cystoscope and into the right ureteral orifice.  Contrast was then injected through the cone tip catheter.  There is no filling defect in the ureter.  The cone tip catheter was removed.  A guidewire was then passed through the cystoscope into the ureter.  The cystoscope was removed.  A #6.5 French rigid ureteroscope was then passed in the bladder and in the ureter. There was no evidence of stone or tumor in the ureter, and the ureteroscope was advanced all the way up into the renal pelvis.  The ureteroscope was then removed.  The guidewire was backloaded into the  cystoscope, and a #6 - 24 double-J catheter was passed over the guidewire.  The proximal coil of the double-J catheter is in the renal pelvis.  The distal coil is in the bladder.  The bladder was then emptied and the cystoscope removed.  The patient tolerated the procedure well and left the OR in satisfactory condition to postanesthesia care unit. Dictated by:   Lindaann Slough, M.D. Attending Physician:  Lindaann Slough DD:  12/09/01 TD:  12/09/01 Job: [id number]

## 2012-08-16 ENCOUNTER — Emergency Department (HOSPITAL_COMMUNITY): Payer: BC Managed Care – PPO

## 2012-08-16 ENCOUNTER — Emergency Department (HOSPITAL_COMMUNITY)
Admission: EM | Admit: 2012-08-16 | Discharge: 2012-08-16 | Disposition: A | Discharge status: Home / Self Care | Payer: BC Managed Care – PPO | Attending: Emergency Medicine | Admitting: Emergency Medicine

## 2012-08-16 ENCOUNTER — Encounter (HOSPITAL_COMMUNITY): Payer: Self-pay | Admitting: Emergency Medicine

## 2012-08-16 DIAGNOSIS — Z7982 Long term (current) use of aspirin: Secondary | ICD-10-CM | POA: Insufficient documentation

## 2012-08-16 DIAGNOSIS — Z87442 Personal history of urinary calculi: Secondary | ICD-10-CM | POA: Insufficient documentation

## 2012-08-16 DIAGNOSIS — F17200 Nicotine dependence, unspecified, uncomplicated: Secondary | ICD-10-CM | POA: Insufficient documentation

## 2012-08-16 DIAGNOSIS — M549 Dorsalgia, unspecified: Secondary | ICD-10-CM | POA: Insufficient documentation

## 2012-08-16 DIAGNOSIS — Z90710 Acquired absence of both cervix and uterus: Secondary | ICD-10-CM | POA: Insufficient documentation

## 2012-08-16 DIAGNOSIS — R109 Unspecified abdominal pain: Secondary | ICD-10-CM

## 2012-08-16 LAB — CBC WITH DIFFERENTIAL/PLATELET
Basophils Absolute: 0 10*3/uL (ref 0.0–0.1)
Eosinophils Relative: 4 % (ref 0–5)
Lymphs Abs: 4.4 10*3/uL — ABNORMAL HIGH (ref 0.7–4.0)
MCV: 93 fL (ref 78.0–100.0)
Monocytes Absolute: 0.9 10*3/uL (ref 0.1–1.0)
Monocytes Relative: 8 % (ref 3–12)
Neutrophils Relative %: 49 % (ref 43–77)
Platelets: 273 10*3/uL (ref 150–400)
RBC: 4.72 MIL/uL (ref 3.87–5.11)
RDW: 12.9 % (ref 11.5–15.5)
WBC: 11.4 10*3/uL — ABNORMAL HIGH (ref 4.0–10.5)

## 2012-08-16 LAB — URINALYSIS, ROUTINE W REFLEX MICROSCOPIC
Glucose, UA: NEGATIVE mg/dL
Hgb urine dipstick: NEGATIVE
pH: 6 (ref 5.0–8.0)

## 2012-08-16 LAB — COMPREHENSIVE METABOLIC PANEL
ALT: 19 U/L (ref 0–35)
AST: 21 U/L (ref 0–37)
Albumin: 3.5 g/dL (ref 3.5–5.2)
CO2: 25 mEq/L (ref 19–32)
Calcium: 9.7 mg/dL (ref 8.4–10.5)
Chloride: 107 mEq/L (ref 96–112)
Creatinine, Ser: 0.79 mg/dL (ref 0.50–1.10)
Sodium: 141 mEq/L (ref 135–145)

## 2012-08-16 LAB — URINE MICROSCOPIC-ADD ON

## 2012-08-16 NOTE — ED Notes (Signed)
Pt alert, arrives from home, c/o left flank to left abd pain, states pain begins in flank, onset was several weeks ago, radiates to abd, resp even unlabored, skin pwd

## 2012-08-16 NOTE — ED Provider Notes (Signed)
CSN: [id number]  Arrival date & time 08/16/12 0220  First MD Initiated Contact with Patient 08/16/12 0431  Chief Complaint   Patient presents with   .  Abdominal Pain    (Consider location/radiation/quality/duration/timing/severity/associated sxs/prior  treatment)  HPI Comments: 41 y/o female presents to the ED complaining of abdominal pain for the past 2 weeks. Pain located in left flank radiating to her back described as a constant dull rated 4/10 with occasional sharpness at random rated 10/10. She took a percocet last night which she takes for joint pain without any change in her abdominal pain. Denies nausea, vomiting, fever, chills, bowel or bladder complaints. Has a history of kidney stones but states this pain is not as severe. Denies hematuria. Has history of hysterectomy and states ever since this surgery which was "many years ago" she always has general abdominal tenderness.  Patient is a 42 y.o. female presenting with abdominal pain. The history is provided by the patient.  Abdominal Pain  The primary symptoms of the illness include abdominal pain. The primary symptoms of the illness do not include fever, shortness of breath, nausea, vomiting or dysuria.  Additional symptoms associated with the illness include back pain. Symptoms associated with the illness do not include chills or hematuria.   History reviewed. No pertinent past medical history.  Past Surgical History   Procedure  Date   .  Abdominal hysterectomy    .  Fracture surgery     No family history on file.  History   Substance Use Topics   .  Smoking status:  Current Every Day Smoker   .  Smokeless tobacco:  Not on file   .  Alcohol Use:  No    OB History    Grav  Para  Term  Preterm  Abortions  TAB  SAB  Ect  Mult  Living                  Review of Systems  Constitutional: Negative for fever and chills.  HENT: Negative for neck pain.  Respiratory: Negative for shortness of breath.  Cardiovascular: Negative  for chest pain.  Gastrointestinal: Positive for abdominal pain. Negative for nausea and vomiting.  Genitourinary: Negative for dysuria, hematuria, decreased urine volume, difficulty urinating and pelvic pain.  Musculoskeletal: Positive for back pain.  Skin: Negative for rash.  Neurological: Negative for dizziness and light-headedness.  Psychiatric/Behavioral: The patient is not nervous/anxious.   Allergies   Review of patient's allergies indicates no known allergies.  Home Medications    Current Outpatient Rx   Name  Route  Sig  Dispense  Refill   .  ASPIRIN EC 81 MG PO TBEC  Oral  Take 81 mg by mouth daily.     .  OXYCODONE-ACETAMINOPHEN 10-650 MG PO TABS  Oral  Take 1 tablet by mouth every 12 (twelve) hours as needed. pain     .  PRAVASTATIN SODIUM 40 MG PO TABS  Oral  Take 40 mg by mouth daily.     Marland Kitchen  ZOLPIDEM TARTRATE 10 MG PO TABS  Oral  Take 10 mg by mouth at bedtime as needed. Sleep      BP 136/87  Pulse 98  Temp 98 F (36.7 C)  Resp 16  Wt 190 lb (86.183 kg)  SpO2 98%  Physical Exam  Constitutional: She is oriented to person, place, and time. She appears well-developed and well-nourished. No distress.  HENT:  Head: Normocephalic and atraumatic.  Mouth/Throat: Oropharynx is clear  and moist.  Eyes: Conjunctivae normal and EOM are normal.  Neck: Normal range of motion. Neck supple.  Cardiovascular: Normal rate, regular rhythm, normal heart sounds and intact distal pulses.  Pulmonary/Chest: Effort normal and breath sounds normal. No respiratory distress.  Abdominal: Soft. Normal appearance and bowel sounds are normal. There is generalized tenderness (worse in LUQ and RUQ). There is guarding and CVA tenderness (on left).  No peritoneal signs  Musculoskeletal: Normal range of motion. She exhibits no edema.  Neurological: She is alert and oriented to person, place, and time.  Skin: Skin is warm and dry. No rash noted.  Psychiatric: She has a normal mood and affect. Her  behavior is normal.   ED Course   Procedures (including critical care time)  Labs Reviewed   CBC WITH DIFFERENTIAL - Abnormal; Notable for the following:    WBC  11.4 (*)      Lymphs Abs  4.4 (*)      All other components within normal limits   COMPREHENSIVE METABOLIC PANEL - Abnormal; Notable for the following:    Glucose, Bld  110 (*)      Total Bilirubin  0.2 (*)      All other components within normal limits   URINALYSIS, ROUTINE W REFLEX MICROSCOPIC - Abnormal; Notable for the following:    Leukocytes, UA  TRACE (*)      All other components within normal limits   LIPASE, BLOOD   URINE MICROSCOPIC-ADD ON    Ct Abdomen Wo Contrast  08/16/2012 *RADIOLOGY REPORT* Clinical Data: Left flank pain and left abdominal pain. CT ABDOMEN AND PELVIS WITHOUT CONTRAST Technique: Multidetector CT imaging of the abdomen and pelvis was performed following the standard protocol without intravenous contrast. Comparison: 05/23/2011 Findings: The lung bases are clear. The kidneys appear symmetrical in size and shape. No pyelocaliectasis or ureterectasis. No renal or ureteral stone or obstruction. No bladder stone or bladder wall thickening. The unenhanced appearance of the liver, spleen, pancreas, adrenal glands, abdominal aorta, and retroperitoneal lymph nodes is unremarkable. The stomach, small bowel, and colon are not abnormally distended. No free air or free fluid in the abdomen. Pelvis: The appendix is normal. The uterus is surgically absent. No abnormal adnexal masses. No free or loculated pelvic fluid collections. No significant pelvic lymphadenopathy. No evidence of diverticulitis. The normal alignment of the lumbar vertebra with minimal degenerative change. No significant change since previous study. IMPRESSION: No renal or ureteral stone or obstruction. Original Report Authenticated By: Burman Nieves, M.D.   No diagnosis found.  MDM   41 y/o female with history of kidney stones presenting with left  sided abdominal pain radiating to her back. Tenderness on exam most prominent in LUQ and RUQ. Positive CVA tenderness on left. CT scan negative for stone. Will obtain abdominal ultrasound to r/o gallbladder pathology. Patient admits to not eating a very healthy diet.  6:03 AM  Case discussed with Roxy Horseman, PA-C who will take over care of patient at this time.   9:03 AM  Results for orders placed during the hospital encounter of 08/16/12  CBC WITH DIFFERENTIAL      Component Value Range   WBC 11.4 (*) 4.0 - 10.5 K/uL   RBC 4.72  3.87 - 5.11 MIL/uL   Hemoglobin 14.9  12.0 - 15.0 g/dL   HCT 16.1  09.6 - 04.5 %   MCV 93.0  78.0 - 100.0 fL   MCH 31.6  26.0 - 34.0 pg   MCHC 33.9  30.0 -  36.0 g/dL   RDW 16.1  09.6 - 04.5 %   Platelets 273  150 - 400 K/uL   Neutrophils Relative 49  43 - 77 %   Lymphocytes Relative 39  12 - 46 %   Monocytes Relative 8  3 - 12 %   Eosinophils Relative 4  0 - 5 %   Basophils Relative 0  0 - 1 %   Neutro Abs 5.6  1.7 - 7.7 K/uL   Lymphs Abs 4.4 (*) 0.7 - 4.0 K/uL   Monocytes Absolute 0.9  0.1 - 1.0 K/uL   Eosinophils Absolute 0.5  0.0 - 0.7 K/uL   Basophils Absolute 0.0  0.0 - 0.1 K/uL   Smear Review MORPHOLOGY UNREMARKABLE    COMPREHENSIVE METABOLIC PANEL      Component Value Range   Sodium 141  135 - 145 mEq/L   Potassium 3.8  3.5 - 5.1 mEq/L   Chloride 107  96 - 112 mEq/L   CO2 25  19 - 32 mEq/L   Glucose, Bld 110 (*) 70 - 99 mg/dL   BUN 19  6 - 23 mg/dL   Creatinine, Ser 4.09  0.50 - 1.10 mg/dL   Calcium 9.7  8.4 - 81.1 mg/dL   Total Protein 7.0  6.0 - 8.3 g/dL   Albumin 3.5  3.5 - 5.2 g/dL   AST 21  0 - 37 U/L   ALT 19  0 - 35 U/L   Alkaline Phosphatase 75  39 - 117 U/L   Total Bilirubin 0.2 (*) 0.3 - 1.2 mg/dL   GFR calc non Af Amer >90  >90 mL/min   GFR calc Af Amer >90  >90 mL/min  LIPASE, BLOOD      Component Value Range   Lipase 29  11 - 59 U/L  URINALYSIS, ROUTINE W REFLEX MICROSCOPIC      Component Value Range   Color,  Urine YELLOW  YELLOW   APPearance CLEAR  CLEAR   Specific Gravity, Urine 1.023  1.005 - 1.030   pH 6.0  5.0 - 8.0   Glucose, UA NEGATIVE  NEGATIVE mg/dL   Hgb urine dipstick NEGATIVE  NEGATIVE   Bilirubin Urine NEGATIVE  NEGATIVE   Ketones, ur NEGATIVE  NEGATIVE mg/dL   Protein, ur NEGATIVE  NEGATIVE mg/dL   Urobilinogen, UA 0.2  0.0 - 1.0 mg/dL   Nitrite NEGATIVE  NEGATIVE   Leukocytes, UA TRACE (*) NEGATIVE  URINE MICROSCOPIC-ADD ON      Component Value Range   Squamous Epithelial / LPF RARE  RARE   WBC, UA 0-2  <3 WBC/hpf   RBC / HPF 0-2  <3 RBC/hpf   Bacteria, UA RARE  RARE   Ct Abdomen Wo Contrast  08/16/2012  *RADIOLOGY REPORT*  Clinical Data:  Left flank pain and left abdominal pain.  CT ABDOMEN AND PELVIS WITHOUT CONTRAST  Technique:  Multidetector CT imaging of the abdomen and pelvis was performed following the standard protocol without intravenous contrast.  Comparison:  05/23/2011  Findings:  The lung bases are clear.  The kidneys appear symmetrical in size and shape.  No pyelocaliectasis or ureterectasis.  No renal or ureteral stone or obstruction.  No bladder stone or bladder wall thickening.  The unenhanced appearance of the liver, spleen, pancreas, adrenal glands, abdominal aorta, and retroperitoneal lymph nodes is unremarkable.  The stomach, small bowel, and colon are not abnormally distended.  No free air or free fluid in the abdomen.  Pelvis:  The  appendix is normal.  The uterus is surgically absent. No abnormal adnexal masses.  No free or loculated pelvic fluid collections.  No significant pelvic lymphadenopathy.  No evidence of diverticulitis.  The normal alignment of the lumbar vertebra with minimal degenerative change.  No significant change since previous study.  IMPRESSION: No renal or ureteral stone or obstruction.   Original Report Authenticated By: Burman Nieves, M.D.    US Abdomen Complete  08/16/2012  *RADIOLOGY REPORT*  Clinical Data:  Abdominal pain  ABDOMINAL  ULTRASOUND COMPLETE  Comparison:  08/16/2012 CT without contrast  Findings:  Gallbladder:  No gallstones, gallbladder wall thickening, or pericholecystic fluid.  Common Bile Duct:  Within normal limits in caliber.  Liver: No focal mass lesion identified.  Within normal limits in parenchymal echogenicity.  IVC:  Appears normal.  Pancreas:  Slight nonspecific increased echogenicity but no ductal dilatation or focal abnormality.  Normal appearance by noncontrast CT comparison from earlier today.  Spleen:  Within normal limits in size and echotexture.  Right kidney:  Normal in size and parenchymal echogenicity.  No evidence of mass or hydronephrosis.  Left kidney:  Normal in size and parenchymal echogenicity.  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  No hydronephrosis.  Abdominal Aorta:  No aneurysm identified.  IMPRESSION: No acute finding by abdominal ultrasound.  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   Original Report Authenticated By: Judie Petit. Miles Costain, M.D.      The above was documented by Johnnette Gourd, PA-C.  The patient was also discussed with Dr. Rosalia Hammers.  The plan was to discharge the patient pending negative abdominal US.  The results are detailed above.  I am going to discharge the patient to home with primary care follow-up.  The patient is agreeable with this.  I have re-evaluated the patient, she states that she is feeling a little better.  She is agreeable to following up with her PCP.  Patient is stable and ready for discharge.  Roxy Horseman, PA-C 08/16/12 [phone]

## 2012-08-16 NOTE — ED Provider Notes (Signed)
History     CSN: [id number]  Arrival date & time 08/16/12  0220   First MD Initiated Contact with Patient 08/16/12 0431      Chief Complaint  Patient presents with  . Abdominal Pain    (Consider location/radiation/quality/duration/timing/severity/associated sxs/prior treatment) HPI Comments: 41 y/o female presents to the ED complaining of abdominal pain for the past 2 weeks. Pain located in left flank radiating to her back described as a constant dull rated 4/10 with occasional sharpness at random rated 10/10. She took a percocet last night which she takes for joint pain without any change in her abdominal pain. Denies nausea, vomiting, fever, chills, bowel or bladder complaints. Has a history of kidney stones but states this pain is not as severe. Denies hematuria. Has history of hysterectomy and states ever since this surgery which was "many years ago" she always has general abdominal tenderness.  Patient is a 41 y.o. female presenting with abdominal pain. The history is provided by the patient.  Abdominal Pain The primary symptoms of the illness include abdominal pain. The primary symptoms of the illness do not include fever, shortness of breath, nausea, vomiting or dysuria.  Additional symptoms associated with the illness include back pain. Symptoms associated with the illness do not include chills or hematuria.    History reviewed. No pertinent past medical history.  Past Surgical History  Procedure Date  . Abdominal hysterectomy   . Fracture surgery     No family history on file.  History  Substance Use Topics  . Smoking status: Current Every Day Smoker  . Smokeless tobacco: Not on file  . Alcohol Use: No    OB History    Grav Para Term Preterm Abortions TAB SAB Ect Mult Living                  Review of Systems  Constitutional: Negative for fever and chills.  HENT: Negative for neck pain.   Respiratory: Negative for shortness of breath.   Cardiovascular:  Negative for chest pain.  Gastrointestinal: Positive for abdominal pain. Negative for nausea and vomiting.  Genitourinary: Negative for dysuria, hematuria, decreased urine volume, difficulty urinating and pelvic pain.  Musculoskeletal: Positive for back pain.  Skin: Negative for rash.  Neurological: Negative for dizziness and light-headedness.  Psychiatric/Behavioral: The patient is not nervous/anxious.     Allergies  Review of patient's allergies indicates no known allergies.  Home Medications   Current Outpatient Rx  Name Route Sig Dispense Refill  . ASPIRIN EC 81 MG PO TBEC Oral Take 81 mg by mouth daily.    . OXYCODONE-ACETAMINOPHEN 10-650 MG PO TABS Oral Take 1 tablet by mouth every 12 (twelve) hours as needed. pain    . PRAVASTATIN SODIUM 40 MG PO TABS Oral Take 40 mg by mouth daily.    Marland Kitchen ZOLPIDEM TARTRATE 10 MG PO TABS Oral Take 10 mg by mouth at bedtime as needed. Sleep      BP 136/87  Pulse 98  Temp 98 F (36.7 C)  Resp 16  Wt 190 lb (86.183 kg)  SpO2 98%  Physical Exam  Constitutional: She is oriented to person, place, and time. She appears well-developed and well-nourished. No distress.  HENT:  Head: Normocephalic and atraumatic.  Mouth/Throat: Oropharynx is clear and moist.  Eyes: Conjunctivae normal and EOM are normal.  Neck: Normal range of motion. Neck supple.  Cardiovascular: Normal rate, regular rhythm, normal heart sounds and intact distal pulses.   Pulmonary/Chest: Effort normal and breath  sounds normal. No respiratory distress.  Abdominal: Soft. Normal appearance and bowel sounds are normal. There is generalized tenderness (worse in LUQ and RUQ). There is guarding and CVA tenderness (on left).       No peritoneal signs  Musculoskeletal: Normal range of motion. She exhibits no edema.  Neurological: She is alert and oriented to person, place, and time.  Skin: Skin is warm and dry. No rash noted.  Psychiatric: She has a normal mood and affect. Her behavior  is normal.    ED Course  Procedures (including critical care time)  Labs Reviewed  CBC WITH DIFFERENTIAL - Abnormal; Notable for the following:    WBC 11.4 (*)     Lymphs Abs 4.4 (*)     All other components within normal limits  COMPREHENSIVE METABOLIC PANEL - Abnormal; Notable for the following:    Glucose, Bld 110 (*)     Total Bilirubin 0.2 (*)     All other components within normal limits  URINALYSIS, ROUTINE W REFLEX MICROSCOPIC - Abnormal; Notable for the following:    Leukocytes, UA TRACE (*)     All other components within normal limits  LIPASE, BLOOD  URINE MICROSCOPIC-ADD ON   Ct Abdomen Wo Contrast  08/16/2012  *RADIOLOGY REPORT*  Clinical Data:  Left flank pain and left abdominal pain.  CT ABDOMEN AND PELVIS WITHOUT CONTRAST  Technique:  Multidetector CT imaging of the abdomen and pelvis was performed following the standard protocol without intravenous contrast.  Comparison:  05/23/2011  Findings:  The lung bases are clear.  The kidneys appear symmetrical in size and shape.  No pyelocaliectasis or ureterectasis.  No renal or ureteral stone or obstruction.  No bladder stone or bladder wall thickening.  The unenhanced appearance of the liver, spleen, pancreas, adrenal glands, abdominal aorta, and retroperitoneal lymph nodes is unremarkable.  The stomach, small bowel, and colon are not abnormally distended.  No free air or free fluid in the abdomen.  Pelvis:  The appendix is normal.  The uterus is surgically absent. No abnormal adnexal masses.  No free or loculated pelvic fluid collections.  No significant pelvic lymphadenopathy.  No evidence of diverticulitis.  The normal alignment of the lumbar vertebra with minimal degenerative change.  No significant change since previous study.  IMPRESSION: No renal or ureteral stone or obstruction.   Original Report Authenticated By: Burman Nieves, M.D.      No diagnosis found.    MDM  41 y/o female with history of kidney stones  presenting with left sided abdominal pain radiating to her back. Tenderness on exam most prominent in LUQ and RUQ. Positive CVA tenderness on left. CT scan negative for stone. Will obtain abdominal ultrasound to r/o gallbladder pathology. Patient admits to not eating a very healthy diet. 6:03 AM Case discussed with Roxy Horseman, PA-C who will take over care of patient at this time.       Trevor Mace, PA-C 08/16/12 [phone]

## 2012-08-19 ENCOUNTER — Other Ambulatory Visit: Payer: Self-pay | Admitting: *Deleted

## 2012-08-19 DIAGNOSIS — I803 Phlebitis and thrombophlebitis of lower extremities, unspecified: Secondary | ICD-10-CM

## 2012-08-19 DIAGNOSIS — M79609 Pain in unspecified limb: Secondary | ICD-10-CM

## 2012-08-19 NOTE — ED Provider Notes (Signed)
History/physical exam/procedure(s) were performed by non-physician practitioner and as supervising physician I was immediately available for consultation/collaboration. I have reviewed all notes and am in agreement with care and plan.   Makaelyn Aponte S Karley Pho, MD 08/19/12 1950 

## 2012-08-20 NOTE — ED Provider Notes (Signed)
History/physical exam/procedure(s) were performed by non-physician practitioner and as supervising physician I was immediately available for consultation/collaboration. I have reviewed all notes and am in agreement with care and plan.   [place] S Solaris Kram, MD 08/20/12 1437 

## 2012-09-16 ENCOUNTER — Encounter: Payer: Self-pay | Admitting: Vascular Surgery

## 2012-09-17 ENCOUNTER — Encounter: Payer: Self-pay | Admitting: Vascular Surgery

## 2012-09-17 ENCOUNTER — Ambulatory Visit (INDEPENDENT_AMBULATORY_CARE_PROVIDER_SITE_OTHER): Payer: BC Managed Care – PPO | Admitting: Vascular Surgery

## 2012-09-17 ENCOUNTER — Encounter (INDEPENDENT_AMBULATORY_CARE_PROVIDER_SITE_OTHER): Payer: BC Managed Care – PPO | Admitting: *Deleted

## 2012-09-17 VITALS — BP 119/78 | HR 78 | Resp 16 | Ht 64.0 in | Wt 194.0 lb

## 2012-09-17 DIAGNOSIS — M79609 Pain in unspecified limb: Secondary | ICD-10-CM

## 2012-09-17 DIAGNOSIS — I803 Phlebitis and thrombophlebitis of lower extremities, unspecified: Secondary | ICD-10-CM

## 2012-09-17 NOTE — Progress Notes (Signed)
[facility]ist of [facility]  Patient name: [name] MRN: [id number] DOB: April 17, 1971 Sex: female  REASON FOR CONSULT: bilateral lower extremity pain  HPI: [name] is a 41 y.o. female who states that she has had pain in both lower extremities for several years. Over the last 3-4 months her symptoms have progressed some. She experiences aching pain in both lower extremities. Her symptoms are aggravated by standing. They are relieved with rest and elevation. She has had a previous left lower extremity DVT which was treated with Coumadin. This was back in 2011. 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 She is only able to sit for about an hour when working. She's had no recent episodes of phlebitis. I do not get any history of claudication or rest pain. Is no family history of DVT. She has had swelling in both lower extremities when she is standing. She also describes some fatigue and tiredness in her lower extremities.  Past Medical History  Diagnosis Date  . DVT (deep venous thrombosis)   . Hyperlipidemia     Family History  Problem Relation Age of Onset  . Deep vein thrombosis Mother   . Other Mother     varicose veins  . Hyperlipidemia Brother   . Hypertension Brother   . Heart attack Brother     SOCIAL HISTORY: History  Substance Use Topics  . Smoking status: Current Every Day Smoker -- 0.5 packs/day for 20 years    Types: Cigarettes  . Smokeless tobacco: Never Used     Comment: pt states that she is working  . Alcohol Use: No    No Known Allergies  Current Outpatient Prescriptions  Medication Sig Dispense Refill  . aspirin EC 81 MG tablet Take 81 mg by mouth daily.      Marland Kitchen oxyCODONE-acetaminophen (PERCOCET) 10-650 MG per tablet Take 1 tablet by mouth every 12 (twelve) hours as needed. pain      . pravastatin (PRAVACHOL) 40 MG tablet Take 40 mg by mouth daily.      Marland Kitchen zolpidem (AMBIEN) 10 MG  tablet Take 10 mg by mouth at bedtime as needed. Sleep        REVIEW OF SYSTEMS: Arly.Keller ] denotes positive finding; [  ] denotes negative finding  CARDIOVASCULAR:  [ ]  chest pain   [ ]  chest pressure   [ ]  palpitations   [ ]  orthopnea   [ ]  dyspnea on exertion   [ ]  claudication   [ ]  rest pain   [ ]  DVT   [ ]  phlebitis PULMONARY:   [ ]  productive cough   [ ]  asthma   Arly.Keller ] wheezing NEUROLOGIC:   [ ]  weakness  [ ]  paresthesias  [ ]  aphasia  [ ]  amaurosis  Arly.Keller ] dizziness HEMATOLOGIC:   [ ]  bleeding problems   [ ]  clotting disorders MUSCULOSKELETAL:  [ ]  joint pain   [ ]  joint swelling Arly.Keller ] leg swelling GASTROINTESTINAL: [ ]   blood in stool  [ ]   hematemesis GENITOURINARY:  [ ]   dysuria  [ ]   hematuria PSYCHIATRIC:  [ ]  history of major depression INTEGUMENTARY:  [ ]  rashes  [ ]  ulcers CONSTITUTIONAL:  [ ]  fever   [ ]  chills  PHYSICAL EXAM: Filed Vitals:   09/17/12 1104  BP: 119/78  Pulse: 78  Resp: 16  Height: 5\' 4"  (1.626 m)  Weight: 194 lb (87.998 kg)  SpO2: 100%   Body mass index is 33.30 kg/(m^2). GENERAL: The patient is a well-nourished female, in no acute distress. The vital signs are documented above. CARDIOVASCULAR: There is a regular rate and rhythm. I do not detect carotid bruits. She has palpable popliteal pulses and pedal pulses bilaterally. PULMONARY: There is good air exchange bilaterally without wheezing or rales. ABDOMEN: Soft and non-tender with normal pitched bowel sounds.  MUSCULOSKELETAL: There are no major deformities or cyanosis. NEUROLOGIC: No focal weakness or paresthesias are detected. SKIN: she has spider veins) dictation is bilaterally. These are mostly along the medial aspect of both thighs and also posteriorly. PSYCHIATRIC: The patient has a normal affect.  DATA:  I have independently interpreted her venous duplex scan today. There is no evidence of DVT or phlebitis bilaterally. She does have reflux in the proximal right greater saphenous vein over a short  distance. There is no deep venous reflux on the right. No other areas incompetence are noted to on the left side she has incompetence of the saphenofemoral junction only. There is some deep venous reflux noted on the left in the common femoral vein and popliteal vein.  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 Given that she has only a short segment of incompetent valves in the right greater saphenous vein and only incompetence of the saphenofemoral junction on the left I do not think she would require laser ablation of the saphenous vein. I've explained that she could potentially be a candidate for sclerotherapy of her telangiectasias although there is no guarantee that this would relieve her symptoms. We have discussed the importance of intermittent leg elevation and the proper positioning for this. She does have fitted compression stockings and I've encouraged her by new ones every 6 months as these can stretch out. We'll also discussed the importance of avoiding prolonged standing. I've encouraged her to walk as much as possible. I will be happy to see her back at any time if her symptoms progress.   Oziel Beitler S [facility] [place] Beeper: [phone]

## 2012-11-29 ENCOUNTER — Other Ambulatory Visit: Payer: Self-pay

## 2012-12-12 ENCOUNTER — Emergency Department (HOSPITAL_COMMUNITY)
Admission: EM | Admit: 2012-12-12 | Discharge: 2012-12-12 | Disposition: A | Discharge status: Home / Self Care | Payer: BC Managed Care – PPO | Attending: Emergency Medicine | Admitting: Emergency Medicine

## 2012-12-12 ENCOUNTER — Encounter (HOSPITAL_COMMUNITY): Payer: Self-pay | Admitting: *Deleted

## 2012-12-12 ENCOUNTER — Emergency Department (HOSPITAL_COMMUNITY): Payer: BC Managed Care – PPO

## 2012-12-12 DIAGNOSIS — Z7982 Long term (current) use of aspirin: Secondary | ICD-10-CM | POA: Insufficient documentation

## 2012-12-12 DIAGNOSIS — F17200 Nicotine dependence, unspecified, uncomplicated: Secondary | ICD-10-CM | POA: Insufficient documentation

## 2012-12-12 DIAGNOSIS — R091 Pleurisy: Secondary | ICD-10-CM | POA: Insufficient documentation

## 2012-12-12 DIAGNOSIS — R059 Cough, unspecified: Secondary | ICD-10-CM | POA: Insufficient documentation

## 2012-12-12 DIAGNOSIS — Z79899 Other long term (current) drug therapy: Secondary | ICD-10-CM | POA: Insufficient documentation

## 2012-12-12 DIAGNOSIS — Z86718 Personal history of other venous thrombosis and embolism: Secondary | ICD-10-CM | POA: Insufficient documentation

## 2012-12-12 DIAGNOSIS — E785 Hyperlipidemia, unspecified: Secondary | ICD-10-CM | POA: Insufficient documentation

## 2012-12-12 LAB — POCT I-STAT TROPONIN I: Troponin i, poc: 0.01 ng/mL (ref 0.00–0.08)

## 2012-12-12 LAB — CBC WITH DIFFERENTIAL/PLATELET
Basophils Absolute: 0 10*3/uL (ref 0.0–0.1)
Basophils Relative: 0 % (ref 0–1)
Lymphocytes Relative: 29 % (ref 12–46)
MCHC: 33.8 g/dL (ref 30.0–36.0)
Monocytes Absolute: 0.8 10*3/uL (ref 0.1–1.0)
Neutro Abs: 7 10*3/uL (ref 1.7–7.7)
Neutrophils Relative %: 60 % (ref 43–77)
Platelets: 278 10*3/uL (ref 150–400)
RDW: 12.5 % (ref 11.5–15.5)
WBC: 11.5 10*3/uL — ABNORMAL HIGH (ref 4.0–10.5)

## 2012-12-12 LAB — BASIC METABOLIC PANEL
Chloride: 103 mEq/L (ref 96–112)
Creatinine, Ser: 0.74 mg/dL (ref 0.50–1.10)
GFR calc Af Amer: >90 mL/min (ref >90)
Sodium: 136 mEq/L (ref 135–145)

## 2012-12-12 LAB — D-DIMER, QUANTITATIVE: D-Dimer, Quant: <0.27 ug/mL-FEU (ref 0.00–0.48)

## 2012-12-12 MED ORDER — HYDROCOD POLST-CHLORPHEN POLST 10-8 MG/5ML PO LQCR: 5.0000 mL | Freq: Two times a day (BID) | ORAL | Status: DC | PRN

## 2012-12-12 MED ORDER — ASPIRIN 81 MG PO CHEW
324.0000 mg | CHEWABLE_TABLET | Freq: Once | ORAL | Status: AC
  Administered 2012-12-12: 243 mg via ORAL
  Filled 2012-12-12: qty 4

## 2012-12-12 MED ORDER — MORPHINE SULFATE 4 MG/ML IJ SOLN
4.0000 mg | Freq: Once | INTRAMUSCULAR | Status: AC
  Administered 2012-12-12: 4 mg via INTRAVENOUS
  Filled 2012-12-12: qty 1

## 2012-12-12 NOTE — ED Notes (Signed)
PA at bedside.

## 2012-12-12 NOTE — ED Notes (Signed)
Pt reports right sided chest pain that "goes through my lungs" that awoke her this morning around 0230am

## 2012-12-12 NOTE — ED Notes (Signed)
MD at bedside. 

## 2012-12-12 NOTE — ED Provider Notes (Signed)
History     CSN: [id number]  Arrival date & time 12/12/12  0620   First MD Initiated Contact with Patient 12/12/12 0622      Chief Complaint  Patient presents with  . Chest Pain    (Consider location/radiation/quality/duration/timing/severity/associated sxs/prior treatment) HPI Comments: Patient presents today with a chief complaint of right sided chest pain.  She reports that the pain woke her up from sleep around 2:30 AM this morning.  She describes the pain as a sharp, stabbing pain.  Pain radiates through to her back.  Pain has been constant since onset.  Pain worse when lying flat, bending over, taking a deep breath, and coughing.  She denies SOB, fever, chills, hemoptysis, numbness, tingling, dizziness, or lightheadedness.  She denies prior cardiac history.  She does have a history of DVT diagnosed in 2012, but no history of PE.  She is currently not on any anticoagulants.  She stopped coumadin approximately 6 months ago.  She denies prolonged travel or surgery in the past 4 weeks.  Denies any use of estrogen containing medications.  She denies any unilateral swelling or pain of her LE.  She has a history of Hyperlipidemia.  No history of HTN or DM.  She currently smokes 0.5 ppd.  PCP is Dr. Nathanial Rancher with [facility].  Patient is a 42 y.o. female presenting with chest pain. The history is provided by the patient.  Chest Pain Relieved by:  None tried Associated symptoms: cough   Associated symptoms: no abdominal pain, no diaphoresis, no dizziness, no fever, no nausea, no near-syncope, no numbness, no shortness of breath and not vomiting   Risk factors: high cholesterol and smoking   Risk factors: no diabetes mellitus, no hypertension and no immobilization     Past Medical History  Diagnosis Date  . DVT (deep venous thrombosis)   . Hyperlipidemia     Past Surgical History  Procedure Laterality Date  . Abdominal hysterectomy    . Fracture surgery    . Shoulder  surgery    . Wrist surgery    . Dilation and curettage of uterus      Family History  Problem Relation Age of Onset  . Deep vein thrombosis Mother   . Other Mother     varicose veins  . Hyperlipidemia Brother   . Hypertension Brother   . Heart attack Brother     History  Substance Use Topics  . Smoking status: Current Every Day Smoker -- 0.50 packs/day for 20 years    Types: Cigarettes  . Smokeless tobacco: Never Used     Comment: pt states that she is working  . Alcohol Use: No    OB History   Grav Para Term Preterm Abortions TAB SAB Ect Mult Living                  Review of Systems  Constitutional: Negative for fever, chills and diaphoresis.  Respiratory: Positive for cough. Negative for shortness of breath.   Cardiovascular: Positive for chest pain. Negative for leg swelling and near-syncope.  Gastrointestinal: Negative for nausea, vomiting and abdominal pain.  Neurological: Negative for dizziness, syncope, light-headedness and numbness.  All other systems reviewed and are negative.    Allergies  Review of patient's allergies indicates no known allergies.  Home Medications   Current Outpatient Rx  Name  Route  Sig  Dispense  Refill  . albuterol (PROVENTIL HFA;VENTOLIN HFA) 108 (90 BASE) MCG/ACT inhaler   Inhalation   Inhale  2 puffs into the lungs every 6 (six) hours as needed for wheezing.         Marland Kitchen aspirin EC 81 MG tablet   Oral   Take 81 mg by mouth daily.         Marland Kitchen docusate sodium (COLACE) 100 MG capsule   Oral   Take 100 mg by mouth daily as needed for constipation.         Marland Kitchen oxyCODONE-acetaminophen (PERCOCET) 10-650 MG per tablet   Oral   Take 1 tablet by mouth every 12 (twelve) hours as needed. pain         . pravastatin (PRAVACHOL) 40 MG tablet   Oral   Take 40 mg by mouth daily.         . Probiotic Product (PROBIOTIC DAILY PO)   Oral   Take 1 capsule by mouth daily.         Marland Kitchen zolpidem (AMBIEN) 10 MG tablet   Oral   Take  10 mg by mouth at bedtime as needed. Sleep           BP 133/83  Pulse 80  Temp(Src) 97.1 F (36.2 C) (Oral)  Resp 16  SpO2 98%  Physical Exam  Nursing note and vitals reviewed. Constitutional: She appears well-developed and well-nourished. No distress.  HENT:  Head: Normocephalic and atraumatic.  Mouth/Throat: Oropharynx is clear and moist.  Neck: Normal range of motion. Neck supple.  Cardiovascular: Normal rate, regular rhythm, normal heart sounds and intact distal pulses.   Pulmonary/Chest: Effort normal and breath sounds normal. No respiratory distress. She has no wheezes. She has no rales. She exhibits tenderness.    Tenderness to palpation of the right chest  Abdominal: Soft. Bowel sounds are normal. She exhibits no distension and no mass. There is no tenderness. There is no rebound and no guarding.  Musculoskeletal:  No LE swelling or erythema Negative Homan's sign bilaterally  Neurological: She is alert.  Skin: Skin is warm and dry. No rash noted. She is not diaphoretic. No erythema.  Psychiatric: She has a normal mood and affect.    ED Course  Procedures (including critical care time)  Labs Reviewed  CBC WITH DIFFERENTIAL  BASIC METABOLIC PANEL  D-DIMER, QUANTITATIVE   Dg Chest 2 View  12/12/2012  *RADIOLOGY REPORT*  Clinical Data: Chest pain.  Cough and chest congestion.  CHEST - 2 VIEW  Comparison: None.  Findings: The heart size and pulmonary vascularity are normal and the lungs are clear.  No osseous abnormality.  IMPRESSION: Normal exam.   Original Report Authenticated By: Francene Boyers, M.D.      No diagnosis found.   Date: 12/12/2012  Rate: 84  Rhythm: normal sinus rhythm  QRS Axis: normal  Intervals: normal  ST/T Wave abnormalities: normal  Conduction Disutrbances:none  Narrative Interpretation:   Old EKG Reviewed: none available    MDM  Patient presenting with chest pain that has been present 4 hours prior to arrival in the ED. Chest wall  tender to palpation.  Patient is to be discharged with recommendation to follow up with PCP in regards to today's hospital visit. Chest pain is not likely of cardiac or pulmonary etiology d/t presentation, VSS, no tracheal deviation, no JVD or new murmur, Heart RRR, breath sounds equal bilaterally, EKG without acute abnormalities, negative troponin, and negative CXR.   Patient also has a negative d-dimer.  Patient discharged home.  Return precautions discussed with the patient.  Case has been discussed with Dr.  Campos who agrees with the above plan to discharge.         Pascal Lux [place], PA-C 12/12/12 [phone]

## 2012-12-13 NOTE — ED Provider Notes (Signed)
Medical screening examination/treatment/procedure(s) were performed by non-physician practitioner and as supervising physician I was immediately available for consultation/collaboration.   Lyanne Co, MD 12/13/12 1145

## 2012-12-15 ENCOUNTER — Other Ambulatory Visit: Payer: Self-pay | Admitting: Family Medicine

## 2013-01-08 ENCOUNTER — Ambulatory Visit (HOSPITAL_BASED_OUTPATIENT_CLINIC_OR_DEPARTMENT_OTHER): Payer: BC Managed Care – PPO

## 2013-01-09 ENCOUNTER — Ambulatory Visit: Payer: BC Managed Care – PPO

## 2013-01-19 ENCOUNTER — Ambulatory Visit: Payer: BC Managed Care – PPO

## 2013-01-20 ENCOUNTER — Ambulatory Visit: Payer: BC Managed Care – PPO

## 2013-01-30 ENCOUNTER — Ambulatory Visit: Payer: BC Managed Care – PPO

## 2013-04-21 ENCOUNTER — Ambulatory Visit: Payer: BC Managed Care – PPO

## 2013-05-06 ENCOUNTER — Ambulatory Visit
Admission: RE | Admit: 2013-05-06 | Discharge: 2013-05-06 | Disposition: A | Discharge status: Home / Self Care | Payer: BC Managed Care – PPO | Source: Ambulatory Visit | Attending: Family Medicine | Admitting: Family Medicine

## 2013-05-06 DIAGNOSIS — Z1231 Encounter for screening mammogram for malignant neoplasm of breast: Secondary | ICD-10-CM

## 2013-08-20 ENCOUNTER — Other Ambulatory Visit: Payer: Self-pay

## 2013-09-28 ENCOUNTER — Other Ambulatory Visit: Payer: Self-pay | Admitting: Physician Assistant

## 2013-10-02 ENCOUNTER — Ambulatory Visit: Payer: Self-pay

## 2014-04-07 ENCOUNTER — Other Ambulatory Visit: Payer: Self-pay

## 2014-04-07 DIAGNOSIS — Z1231 Encounter for screening mammogram for malignant neoplasm of breast: Secondary | ICD-10-CM

## 2014-05-07 ENCOUNTER — Ambulatory Visit: Payer: BC Managed Care – PPO

## 2014-05-17 ENCOUNTER — Ambulatory Visit
Admission: RE | Admit: 2014-05-17 | Discharge: 2014-05-17 | Disposition: A | Discharge status: Home / Self Care | Payer: BC Managed Care – PPO | Source: Ambulatory Visit

## 2014-05-17 DIAGNOSIS — Z1231 Encounter for screening mammogram for malignant neoplasm of breast: Secondary | ICD-10-CM

## 2014-10-15 HISTORY — PX: TOE SURGERY: SHX1073

## 2015-06-24 ENCOUNTER — Other Ambulatory Visit: Payer: Self-pay

## 2015-06-24 DIAGNOSIS — Z1231 Encounter for screening mammogram for malignant neoplasm of breast: Secondary | ICD-10-CM

## 2015-06-30 ENCOUNTER — Ambulatory Visit: Payer: Self-pay

## 2015-07-25 ENCOUNTER — Ambulatory Visit: Payer: Self-pay

## 2015-08-02 ENCOUNTER — Ambulatory Visit: Payer: Self-pay

## 2015-08-15 ENCOUNTER — Ambulatory Visit: Payer: Self-pay

## 2016-02-16 ENCOUNTER — Ambulatory Visit: Payer: Self-pay

## 2016-02-16 ENCOUNTER — Ambulatory Visit (HOSPITAL_BASED_OUTPATIENT_CLINIC_OR_DEPARTMENT_OTHER): Payer: BLUE CROSS/BLUE SHIELD | Attending: Family Medicine | Admitting: Internal Medicine

## 2016-02-16 VITALS — Ht 64.0 in | Wt 200.0 lb

## 2016-02-16 DIAGNOSIS — G4730 Sleep apnea, unspecified: Secondary | ICD-10-CM

## 2016-02-16 DIAGNOSIS — R0683 Snoring: Secondary | ICD-10-CM | POA: Insufficient documentation

## 2016-02-16 DIAGNOSIS — Z79899 Other long term (current) drug therapy: Secondary | ICD-10-CM | POA: Diagnosis not present

## 2016-02-16 DIAGNOSIS — G4733 Obstructive sleep apnea (adult) (pediatric): Secondary | ICD-10-CM

## 2016-02-17 ENCOUNTER — Ambulatory Visit
Admission: RE | Admit: 2016-02-17 | Discharge: 2016-02-17 | Disposition: A | Discharge status: Home / Self Care | Payer: BLUE CROSS/BLUE SHIELD | Source: Ambulatory Visit

## 2016-02-17 DIAGNOSIS — Z1231 Encounter for screening mammogram for malignant neoplasm of breast: Secondary | ICD-10-CM

## 2016-02-19 DIAGNOSIS — R0683 Snoring: Secondary | ICD-10-CM | POA: Diagnosis not present

## 2016-02-19 DIAGNOSIS — G4730 Sleep apnea, unspecified: Secondary | ICD-10-CM

## 2016-02-19 DIAGNOSIS — G4733 Obstructive sleep apnea (adult) (pediatric): Secondary | ICD-10-CM

## 2016-02-19 NOTE — Procedures (Signed)
Patient Name: [name], [name] Date: 02/16/2016 Gender: Not Specified D.O.B: Oct 14, 1971 Age (years): 44 Referring Provider: Cristela Blue Hamrick Height (inches): 64 Interpreting Physician: Baird Lyons MD, ABSM Weight (lbs): 200 RPSGT: Joni Reining BMI: 34 MRN: [id number] Neck Size: 14.50 CLINICAL INFORMATION Sleep Study Type: Split Night CPAP Indication for sleep study: Snoring, Witnessed Apneas Epworth Sleepiness Score: 24  SLEEP STUDY TECHNIQUE As per the AASM Manual for the Scoring of Sleep and Associated Events v2.3 (April 2016) with a hypopnea requiring 4% desaturations. The channels recorded and monitored were frontal, central and occipital EEG, electrooculogram (EOG), submentalis EMG (chin), nasal and oral airflow, thoracic and abdominal wall motion, anterior tibialis EMG, snore microphone, electrocardiogram, and pulse oximetry. Continuous positive airway pressure (CPAP) was initiated when the patient met split night criteria and was titrated according to treat sleep-disordered breathing.  MEDICATIONS Medications taken by the patient :charted for review Medications administered by patient during sleep study : PERCOCET, ZOLPIDEM TARTRATE  RESPIRATORY PARAMETERS Diagnostic Total AHI (/hr): 37.5 RDI (/hr): 49.6 OA Index (/hr): 10.1 CA Index (/hr): 0.0 REM AHI (/hr): 96.3 NREM AHI (/hr): 17.3 Supine AHI (/hr): 90.9 Non-supine AHI (/hr): 11.91 Min O2 Sat (%): 75.00 Mean O2 (%): 89.79 Time below 88% (min): 27.5   Titration Optimal Pressure (cm): 9 AHI at Optimal Pressure (/hr): 0.0 Min O2 at Optimal Pressure (%): 91.0 Supine % at Optimal (%): 100 Sleep % at Optimal (%): 100    SLEEP ARCHITECTURE The recording time for the entire night was 370.8 minutes. During a baseline period of 179.8 minutes, the patient slept for 148.9 minutes in REM and nonREM, yielding a sleep efficiency of 82.8%. Sleep onset after lights out was 21.9 minutes with a REM latency of 112.0 minutes. The  patient spent 9.06% of the night in stage N1 sleep, 65.42% in stage N2 sleep, 0.00% in stage N3 and 25.51% in REM. During the titration period of 180.5 minutes, the patient slept for 175.5 minutes in REM and nonREM, yielding a sleep efficiency of 97.2%. Sleep onset after CPAP initiation was 5.0 minutes with a REM latency of 23.5 minutes. The patient spent 1.71% of the night in stage N1 sleep, 14.24% in stage N2 sleep, 0.00% in stage N3 and 84.05% in REM.  CARDIAC DATA The 2 lead EKG demonstrated sinus rhythm. The mean heart rate was 76.76 beats per minute. Other EKG findings include: None.  LEG MOVEMENT DATA The total Periodic Limb Movements of Sleep (PLMS) were 0. The PLMS index was 0.00 .  IMPRESSIONS - Severe obstructive sleep apnea occurred during the diagnostic portion of the study (AHI = 37.5/hour). An optimal PAP pressure was selected for this patient ( 9 cm of water) - No significant central sleep apnea occurred during the diagnostic portion of the study (CAI = 0.0/hour). - Mild oxygen desaturation was noted during the diagnostic portion of the study (Min O2 = 75.00%). - The patient snored with Moderate snoring volume during the diagnostic portion of the study. - No cardiac abnormalities were noted during this study. - Clinically significant periodic limb movements did not occur during sleep.  DIAGNOSIS - Obstructive Sleep Apnea (327.23 [G47.33 ICD-10])  RECOMMENDATIONS - Trial of CPAP therapy on 9 cm H2O with a Medium size Fisher&Paykel Nasal Mask Eson mask and heated humidification. - Avoid alcohol, sedatives and other CNS depressants that may worsen sleep apnea and disrupt normal sleep architecture. - Sleep hygiene should be reviewed to assess factors that may improve sleep quality. - Weight management and regular exercise should  be initiated or continued.  Deneise Lever Diplomate, American Board of Sleep Medicine  ELECTRONICALLY SIGNED ON:  02/19/2016, 1:42 PM [place] PH: [phone]   FX: [phone] [place]

## 2017-05-25 ENCOUNTER — Encounter (HOSPITAL_COMMUNITY): Payer: Self-pay | Admitting: Emergency Medicine

## 2017-05-25 ENCOUNTER — Emergency Department (HOSPITAL_COMMUNITY)
Admission: EM | Admit: 2017-05-25 | Discharge: 2017-05-25 | Disposition: A | Discharge status: Home / Self Care | Payer: Worker's Compensation | Attending: Emergency Medicine | Admitting: Emergency Medicine

## 2017-05-25 ENCOUNTER — Emergency Department (HOSPITAL_COMMUNITY): Payer: Worker's Compensation

## 2017-05-25 DIAGNOSIS — W25XXXA Contact with sharp glass, initial encounter: Secondary | ICD-10-CM | POA: Diagnosis not present

## 2017-05-25 DIAGNOSIS — Y929 Unspecified place or not applicable: Secondary | ICD-10-CM | POA: Diagnosis not present

## 2017-05-25 DIAGNOSIS — Z23 Encounter for immunization: Secondary | ICD-10-CM | POA: Insufficient documentation

## 2017-05-25 DIAGNOSIS — Y9389 Activity, other specified: Secondary | ICD-10-CM | POA: Insufficient documentation

## 2017-05-25 DIAGNOSIS — Y998 Other external cause status: Secondary | ICD-10-CM | POA: Diagnosis not present

## 2017-05-25 DIAGNOSIS — S60415A Abrasion of left ring finger, initial encounter: Secondary | ICD-10-CM | POA: Diagnosis not present

## 2017-05-25 DIAGNOSIS — S6992XA Unspecified injury of left wrist, hand and finger(s), initial encounter: Secondary | ICD-10-CM | POA: Diagnosis present

## 2017-05-25 MED ORDER — TETANUS-DIPHTH-ACELL PERTUSSIS 5-2.5-18.5 LF-MCG/0.5 IM SUSP
0.5000 mL | Freq: Once | INTRAMUSCULAR | Status: AC
  Administered 2017-05-25: 0.5 mL via INTRAMUSCULAR
  Filled 2017-05-25: qty 0.5

## 2017-05-25 NOTE — ED Provider Notes (Signed)
[facility] DEPT Provider Note   CSN: [id number] Arrival date & time: 05/25/17  4854   By signing my name below, I, Eunice Blase, attest that this documentation has been prepared under the direction and in the presence of Quincy Carnes, PA-C. Electronically Signed: Eunice Blase, Scribe. 05/25/17. 6:05 PM.   History   Chief Complaint Chief Complaint  Patient presents with  . Finger Injury   The history is provided by the patient and medical records. No language interpreter was used.    [name] is a 46 y.o. female with h/o HLD and DVT presenting to the Emergency Department concerning L 4th digit laceration that she sustained PTA. Associated swelling she states she cut her finger on glass while taking trash to a compactor. Bleeding controlled on evaluation with adhesive dressing. Blood thinner use endorsed; records reveal pt is taking prescribed ASA at home. Last tetanus unknown. No other complaints at this time.   Past Medical History:  Diagnosis Date  . DVT (deep venous thrombosis) ([place])   . Hyperlipidemia     Patient Active Problem List   Diagnosis Date Noted  . Pain in limb 09/17/2012  . Phlebitis and thrombophlebitis of lower extremities, unspecified 09/17/2012    Past Surgical History:  Procedure Laterality Date  . ABDOMINAL HYSTERECTOMY    . DILATION AND CURETTAGE OF UTERUS    . FRACTURE SURGERY    . SHOULDER SURGERY    . WRIST SURGERY      OB History    No data available       Home Medications    Prior to Admission medications   Medication Sig Start Date End Date Taking? Authorizing Provider  albuterol (PROVENTIL HFA;VENTOLIN HFA) 108 (90 BASE) MCG/ACT inhaler Inhale 2 puffs into the lungs every 6 (six) hours as needed for wheezing.    [provider]  aspirin EC 81 MG tablet Take 81 mg by mouth daily.    [provider]  chlorpheniramine-HYDROcodone (TUSSIONEX PENNKINETIC ER) 10-8 MG/5ML LQCR Take 5 mLs by mouth every 12  (twelve) hours as needed. 12/12/12   Hyman Bible, PA-C  docusate sodium (COLACE) 100 MG capsule Take 100 mg by mouth daily as needed for constipation.    [provider]  oxyCODONE-acetaminophen (PERCOCET) 10-650 MG per tablet Take 1 tablet by mouth every 12 (twelve) hours as needed. pain    [provider]  pravastatin (PRAVACHOL) 40 MG tablet Take 40 mg by mouth daily.    [provider]  Probiotic Product (PROBIOTIC DAILY PO) Take 1 capsule by mouth daily.    [provider]  zolpidem (AMBIEN) 10 MG tablet Take 10 mg by mouth at bedtime as needed. Sleep    [provider]    Family History Family History  Problem Relation Age of Onset  . Deep vein thrombosis Mother   . Other Mother        varicose veins  . Hyperlipidemia Brother   . Hypertension Brother   . Heart attack Brother     Social History Social History  Substance Use Topics  . Smoking status: Current Every Day Smoker    Packs/day: 0.50    Years: 20.00    Types: Cigarettes  . Smokeless tobacco: Never Used     Comment: pt states that she is working  . Alcohol use No     Allergies   Patient has no known allergies.   Review of Systems Review of Systems  Constitutional: Negative for diaphoresis and fever.  Respiratory: Negative for shortness of breath.   Gastrointestinal: Negative for nausea and vomiting.  Musculoskeletal: Positive for myalgias. Negative for arthralgias and joint swelling.  Skin: Positive for wound. Negative for color change.  Neurological: Negative for weakness and numbness.  All other systems reviewed and are negative.    Physical Exam Updated Vital Signs BP 139/80 (BP Location: Right Arm)   Pulse 83   Temp 98.4 F (36.9 C) (Oral)   Resp 18   SpO2 97%   Physical Exam  Constitutional: She is oriented to person, place, and time. She appears well-developed and well-nourished.  HENT:  Head: Normocephalic and atraumatic.  Mouth/Throat:  Oropharynx is clear and moist.  Eyes: Pupils are equal, round, and reactive to light. Conjunctivae and EOM are normal.  Neck: Normal range of motion.  Cardiovascular: Normal rate, regular rhythm and normal heart sounds.   Pulmonary/Chest: Effort normal and breath sounds normal.  Abdominal: Soft. Bowel sounds are normal.  Musculoskeletal: Normal range of motion.  Abrasions noted to ulnar aspect of right 4th digit; there is no active bleeding, mild swelling; no bony deformities, normal sensation and cap refill; no retained FB noted  Neurological: She is alert and oriented to person, place, and time.  Skin: Skin is warm and dry.  Psychiatric: She has a normal mood and affect.  Nursing note and vitals reviewed.    ED Treatments / Results  DIAGNOSTIC STUDIES: Oxygen Saturation is 97% on RA, NL by my interpretation.    COORDINATION OF CARE: 5:30 PM-Discussed next steps with pt. Pt verbalized understanding and is agreeable with the plan. Pt prepared for lacer repair   Labs (all labs ordered are listed, but only abnormal results are displayed) Labs Reviewed - No data to display  EKG  EKG Interpretation None       Radiology Dg Hand Complete Left  Result Date: 05/25/2017 CLINICAL DATA:  46 year old female with history of puncture wound in the left ring finger. EXAM: LEFT HAND - COMPLETE 3+ VIEW COMPARISON:  None. FINDINGS: There is no evidence of fracture or dislocation. There is no evidence of arthropathy or other focal bone abnormality. Soft tissues are unremarkable. Specifically, no radiopaque foreign body noted. IMPRESSION: Negative. Electronically Signed   By: Vinnie Langton M.D.   On: 05/25/2017 17:21    Procedures Procedures (including critical care time)  Medications Ordered in ED Medications - No data to display   Initial Impression / Assessment and Plan / ED Course  I have reviewed the triage vital signs and the nursing notes.  Pertinent labs & imaging results that  were available during my care of the patient were reviewed by me and considered in my medical decision making (see chart for details).  46 y.o. F here with Injury from broken glass while taking out trash at work. On exam she has minor abrasions to ulnar aspect of right fourth digit. There is no active bleeding. Mild swelling but no bony deformity. No evidence of retained foreign body on my exam. Normal sensation and cap refill. X-ray negative for any acute bony findings or retained foreign body. Patient's tetanus vaccine was updated. Wound was soaked and dressed here. Discussed home wound care. Can follow-up with PCP for any ongoing issues. Condition discharge home in stable condition.  Final Clinical Impressions(s) / ED Diagnoses   Final diagnoses:  Injury from broken glass, initial encounter    New Prescriptions New Prescriptions   No medications on file   I personally performed the services described in  this documentation, which was scribed in my presence. The recorded information has been reviewed and is accurate.   Larene Pickett, PA-C 05/25/17 Dionicia Abler    Quintella Reichert, MD 05/25/17 [phone]

## 2017-05-25 NOTE — ED Notes (Signed)
Declined W/C at D/C and was escorted to lobby by RN. 

## 2017-05-25 NOTE — Discharge Instructions (Signed)
Keep finger clean with soap and warm water. Can follow-up with your primary care doctor for any ongoing issues. Return here for new concerns.

## 2017-05-25 NOTE — ED Triage Notes (Signed)
Pt to ER for evaluation of left 4th finger injury that occurred when she picked up a trash bag that had broken glass in it. Bleeding controlled.

## 2017-05-25 NOTE — ED Triage Notes (Signed)
PT currently in X-Ray

## 2017-09-25 ENCOUNTER — Ambulatory Visit: Payer: Self-pay

## 2017-09-25 ENCOUNTER — Other Ambulatory Visit: Payer: Self-pay | Admitting: Physician Assistant

## 2017-09-25 DIAGNOSIS — Z139 Encounter for screening, unspecified: Secondary | ICD-10-CM

## 2017-10-25 ENCOUNTER — Ambulatory Visit: Payer: Self-pay

## 2017-11-14 ENCOUNTER — Ambulatory Visit: Payer: Self-pay

## 2017-12-04 ENCOUNTER — Ambulatory Visit: Payer: Self-pay

## 2017-12-04 ENCOUNTER — Ambulatory Visit
Admission: RE | Admit: 2017-12-04 | Discharge: 2017-12-04 | Disposition: A | Discharge status: Home / Self Care | Payer: Managed Care, Other (non HMO) | Source: Ambulatory Visit | Attending: Physician Assistant | Admitting: Physician Assistant

## 2017-12-04 DIAGNOSIS — Z139 Encounter for screening, unspecified: Secondary | ICD-10-CM

## 2018-03-19 ENCOUNTER — Other Ambulatory Visit: Payer: Self-pay | Admitting: Sports Medicine

## 2018-03-19 ENCOUNTER — Ambulatory Visit: Payer: Managed Care, Other (non HMO) | Admitting: Sports Medicine

## 2018-03-19 ENCOUNTER — Ambulatory Visit (INDEPENDENT_AMBULATORY_CARE_PROVIDER_SITE_OTHER): Payer: Managed Care, Other (non HMO)

## 2018-03-19 ENCOUNTER — Telehealth: Payer: Self-pay | Admitting: Sports Medicine

## 2018-03-19 ENCOUNTER — Encounter: Payer: Self-pay | Admitting: Sports Medicine

## 2018-03-19 DIAGNOSIS — Q828 Other specified congenital malformations of skin: Secondary | ICD-10-CM | POA: Diagnosis not present

## 2018-03-19 DIAGNOSIS — M79671 Pain in right foot: Secondary | ICD-10-CM

## 2018-03-19 DIAGNOSIS — S96911A Strain of unspecified muscle and tendon at ankle and foot level, right foot, initial encounter: Secondary | ICD-10-CM

## 2018-03-19 DIAGNOSIS — M25571 Pain in right ankle and joints of right foot: Secondary | ICD-10-CM

## 2018-03-19 MED ORDER — METHYLPREDNISOLONE 4 MG PO TBPK: ORAL_TABLET | ORAL | 0 refills | Status: DC

## 2018-03-19 NOTE — Telephone Encounter (Signed)
Mel We can fax a work note to patient's job stating that she must use cam boot at all times for her foot problem until seen at next visit.  -Dr. Chauncey Cruel

## 2018-03-19 NOTE — Progress Notes (Signed)
Subjective:  [name] is a 47 y.o. female patient who presents to office for evaluation of right ankle pain. Patient complains of continued pain in the ankle x 5 months 8-10/10. Patient has tried biofreeze, OTC arthritis meds with no relief in symptoms. Patient also admits to a callus lesion on bottom of right foot that has been present for years and reports that as long as she keeps it trimmed it does not hurt. Patient denies any other pedal complaints. Denies injury/trip/fall/sprain/any causative factors.   Review of Systems  Musculoskeletal: Positive for joint pain.  All other systems reviewed and are negative.  Patient Active Problem List   Diagnosis Date Noted  . Pain in limb 09/17/2012  . Phlebitis and thrombophlebitis of lower extremities, unspecified 09/17/2012    Current Outpatient Medications on File Prior to Visit  Medication Sig Dispense Refill  . albuterol (PROVENTIL HFA;VENTOLIN HFA) 108 (90 BASE) MCG/ACT inhaler Inhale 2 puffs into the lungs every 6 (six) hours as needed for wheezing.    Marland Kitchen aspirin EC 81 MG tablet Take 81 mg by mouth daily.    . chlorpheniramine-HYDROcodone (TUSSIONEX PENNKINETIC ER) 10-8 MG/5ML LQCR Take 5 mLs by mouth every 12 (twelve) hours as needed. 140 mL 0  . docusate sodium (COLACE) 100 MG capsule Take 100 mg by mouth daily as needed for constipation.    Marland Kitchen oxyCODONE-acetaminophen (PERCOCET) 10-650 MG per tablet Take 1 tablet by mouth every 12 (twelve) hours as needed. pain    . pravastatin (PRAVACHOL) 40 MG tablet Take 40 mg by mouth daily.    . Probiotic Product (PROBIOTIC DAILY PO) Take 1 capsule by mouth daily.    Marland Kitchen zolpidem (AMBIEN) 10 MG tablet Take 10 mg by mouth at bedtime as needed. Sleep     No current facility-administered medications on file prior to visit.     No Known Allergies  Objective:  General: Alert and oriented x3 in no acute distress  Dermatology: + callus with nucleated core sub met 4 on right, No open lesions  bilateral lower extremities, no webspace macerations, no ecchymosis bilateral, all nails x 10 are well manicured.  Vascular: Dorsalis Pedis and Posterior Tibial pedal pulses palpable, Capillary Fill Time 3 seconds,(+) pedal hair growth bilateral, no edema bilateral lower extremities, Temperature gradient within normal limits.  Neurology: Johney Maine sensation intact via light touch bilateral, (- )Tinels sign bilateral.   Musculoskeletal: Mild tenderness with palpation at lateral ankle along peroneal tendon course. Negative talar tilt, Negative tib-fib stress, No instability however history of previous sprain and fracture when she was younger . No pain with calf compression bilateral. Range of motion within normal limits with mild guarding on right ankle. Strength within normal limits in all groups bilateral.   Gait: Antalgic gait  Xrays R Ankle   Impression:Normal minineralzation, mild medial gutter arthritis, no other acute findings.   Assessment and Plan: Problem List Items Addressed This Visit      Other   Pain in limb    Other Visit Diagnoses    Tear of tendon of right ankle, initial encounter    -  Primary   Acute right ankle pain       Relevant Medications   methylPREDNISolone (MEDROL DOSEPAK) 4 MG TBPK tablet   Other Relevant Orders   DG Foot Complete Right   Porokeratosis           -Complete examination performed -Xrays reviewed -Discussed treatement options for ankle pain possible tear and keratosis -Dispensed CAM boot and  compression sleeve to wear at all times when ambulating -Work note given to allow to wear boot when at work due to possible tear -Rx medrol for pain and inflammation -Rx MRI for further eval of lateral ankle r/o possible tear -At no charge trimmed keratosis x 1 on right foot and applied salinocaine and bandaid to remove on tomorrow -Patient to return to office after MRI or sooner if condition worsens.  Landis Martins, DPM

## 2018-03-19 NOTE — Telephone Encounter (Signed)
Patient wanted a note for her job stating Dr Cannon Kettle put her in a boot so they can determine if she'll be okay to work in it or not. If you can call patient back at [phone]

## 2018-03-20 ENCOUNTER — Telehealth: Payer: Self-pay | Admitting: Sports Medicine

## 2018-03-20 ENCOUNTER — Telehealth: Payer: Self-pay | Admitting: *Deleted

## 2018-03-20 DIAGNOSIS — T148XXA Other injury of unspecified body region, initial encounter: Secondary | ICD-10-CM

## 2018-03-20 NOTE — Telephone Encounter (Signed)
Orders to [place] for [employer].

## 2018-03-20 NOTE — Telephone Encounter (Signed)
error 

## 2018-03-20 NOTE — Telephone Encounter (Signed)
Pt called states she doesn't think she will be able to wear the boot at work, and she has the fax number for the letter to work, fax [phone] Attn: Leanne Chang. I confirmed with pt she would like to have the MRI at [facility].

## 2018-03-20 NOTE — Telephone Encounter (Signed)
-----   Message from [place], [place] sent at 03/19/2018  2:29 PM EDT ----- Regarding: MRI R Ankle Pain x5 months at lateral ankle with swelling and weakness  Eval for peroneal tendon tear

## 2018-03-20 NOTE — Telephone Encounter (Signed)
Thanks

## 2018-03-20 NOTE — Telephone Encounter (Signed)
Patient called and left a voicemail yesterday after work hours about the note for her job. I did read what Dr Cannon Kettle wrote back to Shaquela so I called patient to get her fax number so we can fax it over to her.

## 2018-04-02 ENCOUNTER — Telehealth: Payer: Self-pay | Admitting: *Deleted

## 2018-04-02 NOTE — Telephone Encounter (Signed)
Call tomorrow Dr. Cannon Kettle

## 2018-04-02 NOTE — Telephone Encounter (Signed)
[employer] denied the MRI of the right ankle [id number] due to pt had not completed 6 weeks of physician supervised PT, and Dr. Cannon Kettle could contact a Physician Reviewer [phone], Customer ID#: [id number], Reference Code: [id number]. Routed the message to Dr. Cannon Kettle.

## 2018-04-03 NOTE — Telephone Encounter (Signed)
I called and its denied. Will you let patient know that her insurance will not do an MRI and see if patient is willing to have PT for 6 weeks. If she is amenable to PT then please fax orders Thanks Dr. Chauncey Cruel

## 2018-04-03 NOTE — Telephone Encounter (Signed)
Left message informing pt Dr. Cannon Kettle was unable to get authorization for the MRI, Christella Scheuermann is requiring 6 weeks of physician ordered PT, to call if she would like to be referred for PT.

## 2018-04-07 NOTE — Telephone Encounter (Signed)
I informed pt, Dr. Cannon Kettle had performed a PEER to PEER with [employer], and they still denied the MRI until she had performed 6 weeks of physician directed PT. Pt states she has an appt with Dr. Cannon Kettle on 04/09/2018 and I told her she could discuss her options then.

## 2018-04-09 ENCOUNTER — Ambulatory Visit: Payer: Managed Care, Other (non HMO) | Admitting: Sports Medicine

## 2018-04-10 ENCOUNTER — Encounter: Payer: Self-pay | Admitting: *Deleted

## 2018-04-10 ENCOUNTER — Encounter: Payer: Self-pay | Admitting: Sports Medicine

## 2018-04-10 ENCOUNTER — Ambulatory Visit: Payer: Managed Care, Other (non HMO) | Admitting: Sports Medicine

## 2018-04-10 DIAGNOSIS — M779 Enthesopathy, unspecified: Secondary | ICD-10-CM

## 2018-04-10 DIAGNOSIS — M25571 Pain in right ankle and joints of right foot: Secondary | ICD-10-CM | POA: Diagnosis not present

## 2018-04-10 DIAGNOSIS — S93401D Sprain of unspecified ligament of right ankle, subsequent encounter: Secondary | ICD-10-CM

## 2018-04-10 DIAGNOSIS — M79671 Pain in right foot: Secondary | ICD-10-CM | POA: Diagnosis not present

## 2018-04-10 MED ORDER — TRIAMCINOLONE ACETONIDE 10 MG/ML IJ SUSP: 10.0000 mg | Freq: Once | INTRAMUSCULAR | Status: DC

## 2018-04-10 NOTE — Progress Notes (Signed)
Subjective:  [name] is a 47 y.o. female patient who returns to office for follow-up evaluation of right foot and ankle pain.  Patient states that she has been compliant with wearing cam boot and she reports that even with the boot it hurts and reports that she has completed all her medications with no additional relief.  Patient reports that pain and the right side of her ankle is 8 out of 10.  Patient reports that she has been continuing to go to work and has not had any modified duty and can feel her foot and ankle moving around in the boot and by the end of the day is really swollen and tender especially along the lateral side of her foot and ankle.  Patient is aware that her MRI was not approved by [facility] and is here to further discuss treatment options at this point.  Patient Active Problem List   Diagnosis Date Noted  . Pain in limb 09/17/2012  . Phlebitis and thrombophlebitis of lower extremities, unspecified 09/17/2012    Current Outpatient Medications on File Prior to Visit  Medication Sig Dispense Refill  . albuterol (PROVENTIL HFA;VENTOLIN HFA) 108 (90 BASE) MCG/ACT inhaler Inhale 2 puffs into the lungs every 6 (six) hours as needed for wheezing.    . citalopram (CELEXA) 40 MG tablet Take 40 mg by mouth daily.    . methylPREDNISolone (MEDROL DOSEPAK) 4 MG TBPK tablet Take as directed 21 tablet 0  . oxyCODONE-acetaminophen (PERCOCET) 10-650 MG per tablet Take 1 tablet by mouth every 12 (twelve) hours as needed. pain    . pravastatin (PRAVACHOL) 40 MG tablet Take 40 mg by mouth daily.    . Probiotic Product (PROBIOTIC DAILY PO) Take 1 capsule by mouth daily.    . rivaroxaban (XARELTO) 20 MG TABS tablet Take 20 mg by mouth daily with supper.    . zolpidem (AMBIEN) 10 MG tablet Take 10 mg by mouth at bedtime as needed. Sleep     No current facility-administered medications on file prior to visit.     No Known Allergies  Objective:  General: Alert and oriented x3 in no  acute distress  Dermatology: + callus with nucleated core sub met 4 on right, No open lesions bilateral lower extremities, no webspace macerations, no ecchymosis bilateral, all nails x 10 are well manicured.  Vascular: Dorsalis Pedis and Posterior Tibial pedal pulses palpable, Capillary Fill Time 3 seconds,(+) pedal hair growth bilateral, no edema bilateral lower extremities, Temperature gradient within normal limits.  Neurology: Johney Maine sensation intact via light touch bilateral, (- )Tinels sign bilateral.   Musculoskeletal: Mild tenderness with palpation at lateral ankle along peroneal tendon course and lateral ankle gutter. Negative talar tilt, Negative tib-fib stress, No instability however history of previous sprain and fracture when she was younger . No pain with calf compression bilateral. Range of motion within normal limits with mild guarding on right ankle. Strength within normal limits in all groups bilateral.   Gait: Antalgic gait cam boot assisted  Assessment and Plan: Problem List Items Addressed This Visit      Other   Pain in limb    Other Visit Diagnoses    Acute right ankle pain    -  Primary   Sprain of right ankle, unspecified ligament, subsequent encounter       Tendonitis       Relevant Medications   triamcinolone acetonide (KENALOG) 10 MG/ML injection 10 mg (Start on 04/10/2018  9:30 AM)       -  Complete examination performed -Discussed treatement options for ankle pain  -After oral consent and aseptic prep, injected a mixture containing 1 ml of 2%  plain lidocaine, 1 ml 0.5% plain marcaine, 0.5 ml of kenalog 10 and 0.5 ml of dexamethasone phosphate into right lateral ankle along peroneal tendon course without complication. Post-injection care discussed with patient.  -Advised patient to wear ankle brace since cam boot is not helping with good supportive tennis shoe work note given for medical leave for minimum of 2 weeks and prescription given for physical therapy  of which her insurance recommends for her to complete this for about 6 weeks -Dispense ice pack to use as instructed -Patient to return to office in 2 weeks for follow-up evaluation or sooner if condition worsens.  Landis Martins, DPM

## 2018-04-14 ENCOUNTER — Telehealth: Payer: Self-pay | Admitting: Sports Medicine

## 2018-04-14 NOTE — Telephone Encounter (Signed)
This is [facility] in [place]. Our phone number is [phone]. Pt has an appointment for physical therapy tomorrow at 10 am and we need her last office visit note. Pt called and stated she has the prescription and will bring with her. If you could please fax those to [phone] or you can give [place] a call if you need to as we are here until 6 pm today. Thank you for attending to this message for the appointment tomorrow. Take care. Bye bye.

## 2018-04-25 ENCOUNTER — Encounter: Payer: Self-pay | Admitting: Sports Medicine

## 2018-04-25 ENCOUNTER — Ambulatory Visit: Payer: Managed Care, Other (non HMO) | Admitting: Sports Medicine

## 2018-04-25 DIAGNOSIS — S93401D Sprain of unspecified ligament of right ankle, subsequent encounter: Secondary | ICD-10-CM

## 2018-04-25 DIAGNOSIS — M79671 Pain in right foot: Secondary | ICD-10-CM

## 2018-04-25 DIAGNOSIS — M25571 Pain in right ankle and joints of right foot: Secondary | ICD-10-CM

## 2018-04-25 DIAGNOSIS — M779 Enthesopathy, unspecified: Secondary | ICD-10-CM

## 2018-04-25 NOTE — Progress Notes (Signed)
Subjective:  [name] is a 47 y.o. female patient who returns to office for follow-up evaluation of right foot and ankle pain.  Patient reports the pain is about the same sometimes it worsens just depends on how much she is doing.  Patient states that pain is sharp 5 out of 10 especially with excessive walking or standing.  Patient states that she is in physical therapy has currently done 4 sessions and is not yet sure if it is helping.  Patient also states that she has been using ankle brace and that they have been taping her in therapy which seems to help a little however her right foot and ankle is still painful.  Patient denies bruising, redness, warmth, swelling or any other constitutional symptoms at this time.  Patient Active Problem List   Diagnosis Date Noted  . Pain in limb 09/17/2012  . Phlebitis and thrombophlebitis of lower extremities, unspecified 09/17/2012    Current Outpatient Medications on File Prior to Visit  Medication Sig Dispense Refill  . albuterol (PROVENTIL HFA;VENTOLIN HFA) 108 (90 BASE) MCG/ACT inhaler Inhale 2 puffs into the lungs every 6 (six) hours as needed for wheezing.    . citalopram (CELEXA) 40 MG tablet Take 40 mg by mouth daily.    . methylPREDNISolone (MEDROL DOSEPAK) 4 MG TBPK tablet Take as directed 21 tablet 0  . oxyCODONE-acetaminophen (PERCOCET) 10-650 MG per tablet Take 1 tablet by mouth every 12 (twelve) hours as needed. pain    . pravastatin (PRAVACHOL) 40 MG tablet Take 40 mg by mouth daily.    . Probiotic Product (PROBIOTIC DAILY PO) Take 1 capsule by mouth daily.    . rivaroxaban (XARELTO) 20 MG TABS tablet Take 20 mg by mouth daily with supper.    . zolpidem (AMBIEN) 10 MG tablet Take 10 mg by mouth at bedtime as needed. Sleep     Current Facility-Administered Medications on File Prior to Visit  Medication Dose Route Frequency Provider Last Rate Last Dose  . triamcinolone acetonide (KENALOG) 10 MG/ML injection 10 mg  10 mg Other Once  Landis Martins, DPM        No Known Allergies  Objective:  General: Alert and oriented x3 in no acute distress  Dermatology: + Minimal callus with nucleated core sub met 4 on right, No open lesions bilateral lower extremities, no webspace macerations, no ecchymosis bilateral, all nails x 10 are well manicured.  Vascular: Dorsalis Pedis and Posterior Tibial pedal pulses palpable, Capillary Fill Time 3 seconds,(+) pedal hair growth bilateral, no edema bilateral lower extremities, Temperature gradient within normal limits.  Neurology: Johney Maine sensation intact via light touch bilateral, (- )Tinels sign bilateral.   Musculoskeletal: Mild tenderness with palpation at lateral ankle along peroneal tendon course and lateral ankle gutter. Negative talar tilt, Negative tib-fib stress, No instability however history of previous sprain and fracture when she was younger . No pain with calf compression bilateral. Range of motion within normal limits with mild guarding on right ankle. Strength within normal limits in all groups bilateral.   Gait: Antalgic gait in sandals   Assessment and Plan: Problem List Items Addressed This Visit      Other   Pain in limb    Other Visit Diagnoses    Acute right ankle pain    -  Primary   Sprain of right ankle, unspecified ligament, subsequent encounter       Tendonitis          -Complete examination performed -Discussed treatement  and continued options for ankle pain with sprain vs tendonitis  -Advised patient to wear ankle brace  -Continue with PT per insurance recommendations for a total of 6 weeks -Recommend rest, ice, elevation, and over-the-counter anti-inflammatories as needed -Work note given to continue with no work until she has completed her remaining course of physical therapy for 1 month -Patient to return to office in 4 weeks for follow-up evaluation or sooner if condition worsens.  If patient is still in pain at next visit will reorder MRI for  further evaluation.  Landis Martins, DPM

## 2018-05-23 ENCOUNTER — Ambulatory Visit: Payer: Managed Care, Other (non HMO) | Admitting: Sports Medicine

## 2018-05-23 ENCOUNTER — Telehealth: Payer: Self-pay | Admitting: *Deleted

## 2018-05-23 ENCOUNTER — Encounter: Payer: Self-pay | Admitting: *Deleted

## 2018-05-23 ENCOUNTER — Encounter: Payer: Self-pay | Admitting: Sports Medicine

## 2018-05-23 DIAGNOSIS — M779 Enthesopathy, unspecified: Secondary | ICD-10-CM | POA: Diagnosis not present

## 2018-05-23 DIAGNOSIS — M25571 Pain in right ankle and joints of right foot: Secondary | ICD-10-CM

## 2018-05-23 DIAGNOSIS — T148XXA Other injury of unspecified body region, initial encounter: Secondary | ICD-10-CM

## 2018-05-23 DIAGNOSIS — M79671 Pain in right foot: Secondary | ICD-10-CM | POA: Diagnosis not present

## 2018-05-23 DIAGNOSIS — S93401D Sprain of unspecified ligament of right ankle, subsequent encounter: Secondary | ICD-10-CM

## 2018-05-23 NOTE — Progress Notes (Signed)
Subjective:  [name] is a 47 y.o. female patient who returns to office for follow-up evaluation of right foot and ankle pain.  Patient reports the pain is about the same sometimes it worsens just depends on how much she is doing and how she turns her foot.  Patient states that pain is sharp and stinging 5 out of 10 especially with excessive walking or standing.  Patient states that she is in physical therapy and has another session left. No major improvements.  Patient denies new bruising, redness, warmth, swelling or any other constitutional symptoms at this time.  Patient Active Problem List   Diagnosis Date Noted  . Pain in limb 09/17/2012  . Phlebitis and thrombophlebitis of lower extremities, unspecified 09/17/2012    Current Outpatient Medications on File Prior to Visit  Medication Sig Dispense Refill  . albuterol (PROVENTIL HFA;VENTOLIN HFA) 108 (90 BASE) MCG/ACT inhaler Inhale 2 puffs into the lungs every 6 (six) hours as needed for wheezing.    . citalopram (CELEXA) 40 MG tablet Take 40 mg by mouth daily.    . methylPREDNISolone (MEDROL DOSEPAK) 4 MG TBPK tablet Take as directed 21 tablet 0  . oxyCODONE-acetaminophen (PERCOCET) 10-650 MG per tablet Take 1 tablet by mouth every 12 (twelve) hours as needed. pain    . pravastatin (PRAVACHOL) 40 MG tablet Take 40 mg by mouth daily.    . Probiotic Product (PROBIOTIC DAILY PO) Take 1 capsule by mouth daily.    . rivaroxaban (XARELTO) 20 MG TABS tablet Take 20 mg by mouth daily with supper.    . zolpidem (AMBIEN) 10 MG tablet Take 10 mg by mouth at bedtime as needed. Sleep     Current Facility-Administered Medications on File Prior to Visit  Medication Dose Route Frequency Provider Last Rate Last Dose  . triamcinolone acetonide (KENALOG) 10 MG/ML injection 10 mg  10 mg Other Once Landis Martins, DPM        No Known Allergies  Objective:  General: Alert and oriented x3 in no acute distress  Dermatology: + Minimal callus  with nucleated core sub met 4 on right, No open lesions bilateral lower extremities, no webspace macerations, no ecchymosis bilateral, all nails x 10 are well manicured.  Vascular: Dorsalis Pedis and Posterior Tibial pedal pulses palpable, Capillary Fill Time 3 seconds,(+) pedal hair growth bilateral, no edema bilateral lower extremities, Temperature gradient within normal limits.  Neurology: Johney Maine sensation intact via light touch bilateral, (- )Tinels sign bilateral.   Musculoskeletal: Mild tenderness with palpation at lateral ankle along peroneal tendon course and lateral ankle gutter and sinus tarsi. Negative talar tilt, Negative tib-fib stress, No instability however history of previous sprain and fracture when she was younger with subjective instability.  No pain with calf compression bilateral. Range of motion within normal limits with mild guarding on right ankle. Strength within normal limits in all groups bilateral.   Gait: Antalgic gait  Assessment and Plan: Problem List Items Addressed This Visit      Other   Pain in limb    Other Visit Diagnoses    Acute right ankle pain    -  Primary   Sprain of right ankle, unspecified ligament, subsequent encounter       Tendonitis       Tendon tear          -Complete examination performed -Discussed treatement and continued options for ankle pain with sprain vs tendonitis vs tear -Rx MRI for further re-eval of right ankle since  completed 6 weeks of PT with no improvement to evaluate for possible tear -Advised patient to continue to wear ankle brace and good supportive shoes -Recommend rest, ice, elevation, and over-the-counter anti-inflammatories as needed -Work note given to continue with no work until MRI is completed; Disability paperwork completed  -Patient to return to office after MRI or sooner if condition worsens.    Landis Martins, DPM

## 2018-05-23 NOTE — Telephone Encounter (Signed)
-----   Message from Landis Martins, [place] sent at 05/23/2018 10:11 AM EDT ----- Regarding: MRI R Ankle R/o Tear patient has completed 6 weeks of PT with no improvement

## 2018-05-23 NOTE — Telephone Encounter (Signed)
Orders to J. Quintana, RN for pre-cert. 

## 2018-06-10 ENCOUNTER — Encounter: Payer: Self-pay | Admitting: Sports Medicine

## 2018-06-11 ENCOUNTER — Other Ambulatory Visit: Payer: Self-pay | Admitting: Sports Medicine

## 2018-06-11 NOTE — Progress Notes (Signed)
Patient presented to office for disability paperwork to be filled out.  Patient is currently on short-term disability for her right ankle pain and is awaiting MRI.  Patient works in an environment that does not allow accommodations and is routinely required to push pull and lift heavy objects which is likely adding to stress to her right foot and ankle therefore I have allowed her to be out of work until we further work-up her ankle to evaluate for possible tear.  Patient estimated next office visit is July 02, 2018 assuming that she will have her MRI done by this timeframe and her estimated time to return to work assuming that patient will not need surgery is July 23, 2018.  This documentation was completed and a copy of the disability paperwork was made and given to the patient. -Dr. Cannon Kettle

## 2018-06-16 ENCOUNTER — Encounter: Payer: Self-pay | Admitting: Sports Medicine

## 2018-06-17 ENCOUNTER — Encounter: Payer: Self-pay | Admitting: Gastroenterology

## 2018-06-19 ENCOUNTER — Encounter

## 2018-06-19 ENCOUNTER — Encounter: Payer: Self-pay | Admitting: *Deleted

## 2018-06-19 ENCOUNTER — Encounter: Payer: Self-pay | Admitting: Sports Medicine

## 2018-06-19 ENCOUNTER — Ambulatory Visit: Payer: Managed Care, Other (non HMO) | Admitting: Sports Medicine

## 2018-06-19 DIAGNOSIS — M779 Enthesopathy, unspecified: Secondary | ICD-10-CM | POA: Diagnosis not present

## 2018-06-19 DIAGNOSIS — S93401D Sprain of unspecified ligament of right ankle, subsequent encounter: Secondary | ICD-10-CM | POA: Diagnosis not present

## 2018-06-19 DIAGNOSIS — M25571 Pain in right ankle and joints of right foot: Secondary | ICD-10-CM

## 2018-06-19 DIAGNOSIS — M79671 Pain in right foot: Secondary | ICD-10-CM | POA: Diagnosis not present

## 2018-06-19 NOTE — Progress Notes (Signed)
Subjective:  [name] is a 47 y.o. female patient who returns to office for follow-up evaluation of right foot and ankle pain.  Patient reports that she has not been on her foot that much it still bothers her but she does notice a difference if doing better.  States that she has been resting icing elevation and wearing her ankle brace states that now she has more pain in her hips.  To the right foot and ankle patient denies new bruising, redness, warmth, swelling or any other constitutional symptoms at this time.  Patient Active Problem List   Diagnosis Date Noted  . Pain in limb 09/17/2012  . Phlebitis and thrombophlebitis of lower extremities, unspecified 09/17/2012    Current Outpatient Medications on File Prior to Visit  Medication Sig Dispense Refill  . albuterol (PROVENTIL HFA;VENTOLIN HFA) 108 (90 BASE) MCG/ACT inhaler Inhale 2 puffs into the lungs every 6 (six) hours as needed for wheezing.    . citalopram (CELEXA) 40 MG tablet Take 40 mg by mouth daily.    . methylPREDNISolone (MEDROL DOSEPAK) 4 MG TBPK tablet Take as directed 21 tablet 0  . oxyCODONE-acetaminophen (PERCOCET) 10-650 MG per tablet Take 1 tablet by mouth every 12 (twelve) hours as needed. pain    . pravastatin (PRAVACHOL) 40 MG tablet Take 40 mg by mouth daily.    . Probiotic Product (PROBIOTIC DAILY PO) Take 1 capsule by mouth daily.    . rivaroxaban (XARELTO) 20 MG TABS tablet Take 20 mg by mouth daily with supper.    . zolpidem (AMBIEN) 10 MG tablet Take 10 mg by mouth at bedtime as needed. Sleep     Current Facility-Administered Medications on File Prior to Visit  Medication Dose Route Frequency Provider Last Rate Last Dose  . triamcinolone acetonide (KENALOG) 10 MG/ML injection 10 mg  10 mg Other Once Landis Martins, DPM        No Known Allergies  Objective:  General: Alert and oriented x3 in no acute distress  Dermatology: + Minimal callus with nucleated core sub met 4 on right, No open lesions  bilateral lower extremities, no webspace macerations, no ecchymosis bilateral, all nails x 10 are well manicured.  Vascular: Dorsalis Pedis and Posterior Tibial pedal pulses palpable, Capillary Fill Time 3 seconds,(+) pedal hair growth bilateral, no edema bilateral lower extremities, Temperature gradient within normal limits.  Neurology: Johney Maine sensation intact via light touch bilateral, (- )Tinels sign bilateral.   Musculoskeletal: Minimal tenderness with palpation at lateral ankle along peroneal tendon course and lateral ankle gutter and sinus tarsi. Negative talar tilt, Negative tib-fib stress, No instability however history of previous sprain and fracture when she was younger with subjective instability as previously noted.  No pain with calf compression bilateral. Range of motion within normal limits with less guarding on right ankle. Strength within normal limits in all groups bilateral.   Assessment and Plan: Problem List Items Addressed This Visit      Other   Pain in limb    Other Visit Diagnoses    Acute right ankle pain    -  Primary   Sprain of right ankle, unspecified ligament, subsequent encounter       Tendonitis          -Complete examination performed -MRI results reviewed which appear to be negative for any findings with no supporting evidence to explain patient's symptoms -Recommend to slowly increase activity and prepared to return to work on July 14, 2018 with reduced hours of  6 hours and slowly increasing over 2-week intervals back to a full shift with restrictions of no heavy lifting pushing or pulling beyond 25 pounds and must wear ankle brace; disability paperwork completed -Advised patient to continue to wear ankle brace and good supportive shoes -Recommend rest, ice, elevation, and over-the-counter anti-inflammatories as needed -Referral made to orthopedics for patient to have her hips evaluated -Patient to return to office in 4 weeks for final follow-up  after she has started back at work or sooner if condition worsens.    Landis Martins, DPM

## 2018-06-20 ENCOUNTER — Telehealth: Payer: Self-pay | Admitting: *Deleted

## 2018-06-20 DIAGNOSIS — M79609 Pain in unspecified limb: Secondary | ICD-10-CM

## 2018-06-20 NOTE — Telephone Encounter (Signed)
Faxed referral, clinicals and demographics to [facility].

## 2018-06-20 NOTE — Telephone Encounter (Signed)
-----   Message from Landis Martins, [place] sent at 06/19/2018  3:36 PM EDT ----- Regarding: Refer to ortho R>L hip pain. Please evaluate

## 2018-07-08 DIAGNOSIS — M9904 Segmental and somatic dysfunction of sacral region: Secondary | ICD-10-CM | POA: Insufficient documentation

## 2018-07-24 ENCOUNTER — Ambulatory Visit: Payer: Managed Care, Other (non HMO) | Admitting: Sports Medicine

## 2018-07-30 ENCOUNTER — Ambulatory Visit: Payer: Self-pay | Admitting: Gastroenterology

## 2018-08-22 ENCOUNTER — Ambulatory Visit: Payer: Managed Care, Other (non HMO) | Admitting: Sports Medicine

## 2018-08-27 ENCOUNTER — Ambulatory Visit: Payer: Managed Care, Other (non HMO) | Admitting: Sports Medicine

## 2018-08-29 ENCOUNTER — Encounter

## 2018-08-29 ENCOUNTER — Ambulatory Visit: Payer: Self-pay | Admitting: Gastroenterology

## 2018-09-04 ENCOUNTER — Ambulatory Visit: Payer: Managed Care, Other (non HMO) | Admitting: Sports Medicine

## 2018-09-24 ENCOUNTER — Ambulatory Visit: Payer: Self-pay | Admitting: Gastroenterology

## 2019-04-30 ENCOUNTER — Encounter (INDEPENDENT_AMBULATORY_CARE_PROVIDER_SITE_OTHER): Payer: Self-pay

## 2019-04-30 ENCOUNTER — Other Ambulatory Visit: Payer: Self-pay

## 2019-04-30 ENCOUNTER — Telehealth: Payer: Self-pay

## 2019-04-30 ENCOUNTER — Ambulatory Visit: Payer: Managed Care, Other (non HMO) | Admitting: Gastroenterology

## 2019-04-30 ENCOUNTER — Encounter: Payer: Self-pay | Admitting: Gastroenterology

## 2019-04-30 VITALS — BP 126/84 | HR 68 | Temp 98.6°F | Ht 64.0 in | Wt 197.5 lb

## 2019-04-30 DIAGNOSIS — R112 Nausea with vomiting, unspecified: Secondary | ICD-10-CM

## 2019-04-30 DIAGNOSIS — R1011 Right upper quadrant pain: Secondary | ICD-10-CM | POA: Diagnosis not present

## 2019-04-30 DIAGNOSIS — R1110 Vomiting, unspecified: Secondary | ICD-10-CM | POA: Insufficient documentation

## 2019-04-30 DIAGNOSIS — K625 Hemorrhage of anus and rectum: Secondary | ICD-10-CM

## 2019-04-30 DIAGNOSIS — Z7901 Long term (current) use of anticoagulants: Secondary | ICD-10-CM | POA: Diagnosis not present

## 2019-04-30 MED ORDER — SUPREP BOWEL PREP KIT 17.5-3.13-1.6 GM/177ML PO SOLN: 1.0000 | ORAL | 0 refills | Status: DC

## 2019-04-30 MED ORDER — ONDANSETRON 4 MG PO TBDP: 4.0000 mg | ORAL_TABLET | Freq: Four times a day (QID) | ORAL | 0 refills | Status: DC | PRN

## 2019-04-30 NOTE — Telephone Encounter (Signed)
Covid-19 screening questions   Do you now or have you had a fever in the last 14 days? No  Do you have any respiratory symptoms of shortness of breath or cough now or in the last 14 days? No  Do you have any family members or close contacts with diagnosed or suspected Covid-19 in the past 14 days? No  Have you been tested for Covid-19 and found to be positive? No        

## 2019-04-30 NOTE — Progress Notes (Signed)
04/30/2019 [name] [id number] 03-Nov-1970   HISTORY OF PRESENT ILLNESS: This is a 48 year old female who is new to our office.  She was referred here by Cyndi Bender, PA-C, for evaluation of abdominal pain and to discuss possible colonoscopy.  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  The first issue of right upper quadrant abdominal pain says that this pain was occurring on and off for couple of years.  Recently over the past couple of weeks it has become much more frequent/persistent.  She says that it goes around to her right shoulder blade.  Has nausea and has vomited on occasion.  She does have some heartburn and reflux issues, but takes omeprazole 20 mg twice daily.  Occurs frequently after eating.  Does use BC powders a couple of times per week for pain.  She also wanted to discuss colonoscopy.  She says that she has very erratic bowel habits, but they have been like this for most of her life.  She says on occasion she will have loose stools for couple of days and then she will go to having constipation and a lot of straining.  The loose stools only occur on occasion and predominantly she is more constipated.  She admits that she does not drink a lot of water or other fluids other than [facility].  Does not have a great diet.  She does also report seeing occasional bright red blood in her stool.  She has always attributed to hemorrhoids.   Past Medical History:  Diagnosis Date  . DVT (deep venous thrombosis) ([place])   . Hyperlipidemia    Past Surgical History:  Procedure Laterality Date  . ABDOMINAL HYSTERECTOMY    . DILATION AND CURETTAGE OF UTERUS    . FRACTURE SURGERY    . SHOULDER SURGERY    . WRIST SURGERY      reports that she has been smoking cigarettes. She has a 10.00 pack-year smoking history. She has never used smokeless tobacco. She reports that she does not drink alcohol or use  drugs. family history includes COPD in her father; Colon polyps in her father and mother; Deep vein thrombosis in her mother; Heart attack in her brother; Hyperlipidemia in her brother; Hypertension in her brother; Lung cancer in her maternal aunt; Other in her mother and sister. No Known Allergies    Outpatient Encounter Medications as of 04/30/2019  Medication Sig  . albuterol (PROVENTIL HFA;VENTOLIN HFA) 108 (90 BASE) MCG/ACT inhaler Inhale 2 puffs into the lungs every 6 (six) hours as needed for wheezing.  . citalopram (CELEXA) 40 MG tablet Take 40 mg by mouth daily.  Mariane Baumgarten Sodium (STOOL SOFTENER LAXATIVE PO) Take 1 tablet by mouth daily as needed.  Marland Kitchen omeprazole (PRILOSEC) 20 MG capsule Take 20 mg by mouth 2 (two) times daily.   Marland Kitchen oxyCODONE-acetaminophen (PERCOCET) 10-650 MG per tablet Take 1 tablet by mouth every 12 (twelve) hours as needed. pain  . pravastatin (PRAVACHOL) 40 MG tablet Take 40 mg by mouth daily.  . Probiotic Product (PROBIOTIC DAILY PO) Take 1 capsule by mouth daily.  . rivaroxaban (XARELTO) 20 MG TABS tablet Take 20 mg by mouth daily with supper.  . varenicline (CHANTIX) 1 MG tablet 1 tablet 2 (two) times daily.  Marland Kitchen zolpidem (AMBIEN) 10 MG tablet Take 10 mg by mouth at bedtime as needed. Sleep  . [DISCONTINUED] methylPREDNISolone (MEDROL DOSEPAK) 4 MG  TBPK tablet Take as directed  . [DISCONTINUED] triamcinolone acetonide (KENALOG) 10 MG/ML injection 10 mg    No facility-administered encounter medications on file as of 04/30/2019.      REVIEW OF SYSTEMS  : All other systems reviewed and negative except where noted in the History of Present Illness.   PHYSICAL EXAM: BP 126/84   Pulse 68   Temp 98.6 F (37 C)   Ht 5\' 4"  (1.626 m)   Wt 197 lb 8 oz (89.6 kg)   BMI 33.90 kg/m  General: Well developed white female in no acute distress Head: Normocephalic and atraumatic Eyes:  Sclerae anicteric, conjunctiva pink. Ears: Normal auditory acuity Lungs: Clear  throughout to auscultation; no increased WOB. Heart: Regular rate and rhythm; no M/R/G. Abdomen: Soft, non-distended.  BS present.  RUQ TTP. Rectal:  Will be done at the time of colonoscopy. Musculoskeletal: Symmetrical with no gross deformities  Skin: No lesions on visible extremities Extremities: No edema  Neurological: Alert oriented x 4, grossly non-focal Psychological:  Alert and cooperative. Normal mood and affect  ASSESSMENT AND PLAN: *RUQ abdominal pain:  Intermittent over several years, now much more frequent.  After meals.  ? Gallbladder source.  Will check ultrasound.  May need HIDA scan then as well pending results.  With some nausea as well.  Will give zofran to use prn. *Rectal bleeding:  Intermittent, small amounts bright red blood in stool.  Never had colonoscopy in the past.  Will schedule with Dr. Tarri Glenn. *Alternating bowel habits, lifelong, constipation predominant, likely component of IBS and influenced by diet:  Recommended use of daily Miralax. *Chronic anticoagulation with Xarelto due to history of DVT's:  Will hold Xarelto for 1-2 days prior to endoscopic procedures - will instruct when and how to resume after procedure. Benefits and risks of procedure explained including risks of bleeding, perforation, infection, missed lesions, reactions to medications and possible need for hospitalization and surgery for complications. Additional rare but real risk of stroke or other vascular clotting events off Xarelto also explained and need to seek urgent help if any signs of these problems occur. Will communicate by phone or EMR with patient's prescribing provider, Cyndi Bender, PA-C to confirm that holding Xarelto is reasonable in this case.  CC:  Cyndi Bender, PA-C

## 2019-04-30 NOTE — Progress Notes (Signed)
Reviewed. I agree with documentation including the assessment and plan. Will recommend EGD at the time of colonoscopy to evaluation her abdominal pain while she is off Xarelto.   Teka Chanda L. Tarri Glenn, MD, MPH

## 2019-04-30 NOTE — Telephone Encounter (Signed)
[facility] Pre-operative Risk Assessment     Request for surgical clearance:     Endoscopy Procedure  What type of surgery is being performed?     colonoscopy  When is this surgery scheduled?     05/19/19  What type of clearance is required ?   Pharmacy  Are there any medications that need to be held prior to surgery and how long? Xarelto 2 day hold  Practice name and name of physician performing surgery?      [facility]  What is your office phone and fax number?      Phone- [phone]  [phone]  Anesthesia type (None, local, MAC, general) ?       MAC

## 2019-04-30 NOTE — Patient Instructions (Signed)
You have been scheduled for an abdominal ultrasound at [facility] (1st floor of hospital) on 05/05/19 at [place]. Please arrive 15 minutes prior to your appointment for registration. Make certain not to have anything to eat or drink 6 hours prior to your appointment. Should you need to reschedule your appointment, please contact radiology at [phone]. This test typically takes about 30 minutes to perform.  We have sent the following medications to your pharmacy for you to pick up at your convenience: Zofran  You have been scheduled for a colonoscopy. Please follow written instructions given to you at your visit today.  Please pick up your prep supplies at the pharmacy within the next 1-3 days. If you use inhalers (even only as needed), please bring them with you on the day of your procedure. Your physician has requested that you go to www.startemmi.com and enter the access code given to you at your visit today. This web site gives a general overview about your procedure. However, you should still follow specific instructions given to you by our office regarding your preparation for the procedure.

## 2019-05-01 ENCOUNTER — Telehealth: Payer: Self-pay | Admitting: Gastroenterology

## 2019-05-01 NOTE — Telephone Encounter (Signed)
Patient informed. 

## 2019-05-01 NOTE — Telephone Encounter (Signed)
Patient called said that someone called her regarding her appt.

## 2019-05-01 NOTE — Telephone Encounter (Signed)
I reviewed her history. I would recommend that she keep the appointment for the EGD.

## 2019-05-01 NOTE — Telephone Encounter (Signed)
Spoke with patient she agrees to have EGD with colonoscopy. Went over procedure details. She wanted to know if we would potentially cancel the EGD if the ultrasound showed gallstones?

## 2019-05-04 ENCOUNTER — Telehealth: Payer: Self-pay

## 2019-05-04 NOTE — Telephone Encounter (Signed)
Patient informed that we got a fax from her PCP- Cyndi Bender, PA-C stating for her to hold the xarelto the day prior to her procedure and the day of. Will send this fax to be scanned into epic. She verbalized understanding.

## 2019-05-05 ENCOUNTER — Ambulatory Visit (HOSPITAL_COMMUNITY)
Admission: RE | Admit: 2019-05-05 | Discharge: 2019-05-05 | Disposition: A | Discharge status: Home / Self Care | Payer: Managed Care, Other (non HMO) | Source: Ambulatory Visit | Attending: Gastroenterology | Admitting: Gastroenterology

## 2019-05-05 ENCOUNTER — Other Ambulatory Visit: Payer: Self-pay

## 2019-05-05 DIAGNOSIS — R1011 Right upper quadrant pain: Secondary | ICD-10-CM | POA: Insufficient documentation

## 2019-05-16 DIAGNOSIS — Z8719 Personal history of other diseases of the digestive system: Secondary | ICD-10-CM

## 2019-05-16 HISTORY — DX: Personal history of other diseases of the digestive system: Z87.19

## 2019-05-18 ENCOUNTER — Telehealth: Payer: Self-pay | Admitting: Gastroenterology

## 2019-05-18 NOTE — Telephone Encounter (Signed)

## 2019-05-19 ENCOUNTER — Encounter: Payer: Self-pay | Admitting: Gastroenterology

## 2019-05-19 ENCOUNTER — Encounter: Payer: Managed Care, Other (non HMO) | Admitting: Gastroenterology

## 2019-05-19 ENCOUNTER — Other Ambulatory Visit: Payer: Self-pay

## 2019-05-19 ENCOUNTER — Ambulatory Visit (AMBULATORY_SURGERY_CENTER): Payer: Managed Care, Other (non HMO) | Admitting: Gastroenterology

## 2019-05-19 VITALS — BP 126/80 | HR 60 | Temp 98.0°F | Resp 14 | Ht 64.0 in | Wt 197.0 lb

## 2019-05-19 DIAGNOSIS — K449 Diaphragmatic hernia without obstruction or gangrene: Secondary | ICD-10-CM

## 2019-05-19 DIAGNOSIS — K3189 Other diseases of stomach and duodenum: Secondary | ICD-10-CM

## 2019-05-19 DIAGNOSIS — D125 Benign neoplasm of sigmoid colon: Secondary | ICD-10-CM | POA: Diagnosis not present

## 2019-05-19 DIAGNOSIS — D124 Benign neoplasm of descending colon: Secondary | ICD-10-CM | POA: Diagnosis not present

## 2019-05-19 DIAGNOSIS — R1011 Right upper quadrant pain: Secondary | ICD-10-CM

## 2019-05-19 DIAGNOSIS — D122 Benign neoplasm of ascending colon: Secondary | ICD-10-CM | POA: Diagnosis not present

## 2019-05-19 DIAGNOSIS — K259 Gastric ulcer, unspecified as acute or chronic, without hemorrhage or perforation: Secondary | ICD-10-CM

## 2019-05-19 DIAGNOSIS — K625 Hemorrhage of anus and rectum: Secondary | ICD-10-CM

## 2019-05-19 DIAGNOSIS — K2980 Duodenitis without bleeding: Secondary | ICD-10-CM | POA: Diagnosis not present

## 2019-05-19 DIAGNOSIS — D123 Benign neoplasm of transverse colon: Secondary | ICD-10-CM

## 2019-05-19 MED ORDER — SODIUM CHLORIDE 0.9 % IV SOLN: 500.0000 mL | Freq: Once | INTRAVENOUS | Status: DC

## 2019-05-19 MED ORDER — PANTOPRAZOLE SODIUM 40 MG PO TBEC: 40.0000 mg | DELAYED_RELEASE_TABLET | Freq: Two times a day (BID) | ORAL | 2 refills | Status: DC

## 2019-05-19 NOTE — Patient Instructions (Signed)
Discharge instructions given. Handouts on polyps,hemorrhoids. Resume previous medications including Xarelto today. Avoid NSAIDS. YOU HAD AN ENDOSCOPIC PROCEDURE TODAY AT [facility]:   Refer to the procedure report that was given to you for any specific questions about what was found during the examination.  If the procedure report does not answer your questions, please call your gastroenterologist to clarify.  If you requested that your care partner not be given the details of your procedure findings, then the procedure report has been included in a sealed envelope for you to review at your convenience later.  YOU SHOULD EXPECT: Some feelings of bloating in the abdomen. Passage of more gas than usual.  Walking can help get rid of the air that was put into your GI tract during the procedure and reduce the bloating. If you had a lower endoscopy (such as a colonoscopy or flexible sigmoidoscopy) you may notice spotting of blood in your stool or on the toilet paper. If you underwent a bowel prep for your procedure, you may not have a normal bowel movement for a few days.  Please Note:  You might notice some irritation and congestion in your nose or some drainage.  This is from the oxygen used during your procedure.  There is no need for concern and it should clear up in a day or so.  SYMPTOMS TO REPORT IMMEDIATELY:   Following lower endoscopy (colonoscopy or flexible sigmoidoscopy):  Excessive amounts of blood in the stool  Significant tenderness or worsening of abdominal pains  Swelling of the abdomen that is new, acute  Fever of 100F or higher   Following upper endoscopy (EGD)  Vomiting of blood or coffee ground material  New chest pain or pain under the shoulder blades  Painful or persistently difficult swallowing  New shortness of breath  Fever of 100F or higher  Black, tarry-looking stools  For urgent or emergent issues, a gastroenterologist can be reached at any hour  by calling [phone].   DIET:  We do recommend a small meal at first, but then you may proceed to your regular diet.  Drink plenty of fluids but you should avoid alcoholic beverages for 24 hours.  ACTIVITY:  You should plan to take it easy for the rest of today and you should NOT DRIVE or use heavy machinery until tomorrow (because of the sedation medicines used during the test).    FOLLOW UP: Our staff will call the number listed on your records 48-72 hours following your procedure to check on you and address any questions or concerns that you may have regarding the information given to you following your procedure. If we do not reach you, we will leave a message.  We will attempt to reach you two times.  During this call, we will ask if you have developed any symptoms of COVID 19. If you develop any symptoms (ie: fever, flu-like symptoms, shortness of breath, cough etc.) before then, please call [phone].  If you test positive for Covid 19 in the 2 weeks post procedure, please call and report this information to [place].    If any biopsies were taken you will be contacted by phone or by letter within the next 1-3 weeks.  Please call us at [phone] if you have not heard about the biopsies in 3 weeks.    SIGNATURES/CONFIDENTIALITY: You and/or your care partner have signed paperwork which will be entered into your electronic medical record.  These signatures attest to the fact that  that the information above on your After Visit Summary has been reviewed and is understood.  Full responsibility of the confidentiality of this discharge information lies with you and/or your care-partner.

## 2019-05-19 NOTE — Op Note (Signed)
[facility] Patient Name: [name] Procedure Date: 05/19/2019 12:36 PM MRN: [id number] Endoscopist: Thornton Park MD, MD Age: 48 Referring MD:  Date of Birth: September 13, 1971 Gender: Female Account #: [id number] Procedure:                Colonoscopy Indications:              Abdominal pain in the right upper quadrant, Rectal                            bleeding                           Mother and father with colon polyps Medicines:                See the Anesthesia note for documentation of the                            administered medications Procedure:                Pre-Anesthesia Assessment:                           - Prior to the procedure, a History and Physical                            was performed, and patient medications and                            allergies were reviewed. The patient's tolerance of                            previous anesthesia was also reviewed. The risks                            and benefits of the procedure and the sedation                            options and risks were discussed with the patient.                            All questions were answered, and informed consent                            was obtained. Prior Anticoagulants: The patient has                            taken Xarelto (rivaroxaban), last dose was 2 days                            prior to procedure. ASA Grade Assessment: II - A                            patient with mild systemic disease. After reviewing  the risks and benefits, the patient was deemed in                            satisfactory condition to undergo the procedure.                           After obtaining informed consent, the colonoscope                            was passed under direct vision. Throughout the                            procedure, the patient's blood pressure, pulse, and                            oxygen saturations were monitored continuously. The                             Colonoscope was introduced through the anus and                            advanced to the the terminal ileum, with                            identification of the appendiceal orifice and IC                            valve. A second forward view of the right colon was                            performed. The colonoscopy was performed without                            difficulty. The patient tolerated the procedure                            well. The quality of the bowel preparation was                            good. The terminal ileum, ileocecal valve,                            appendiceal orifice, and rectum were photographed. Scope In: 1:07:23 PM Scope Out: 1:30:11 PM Scope Withdrawal Time: 0 hours 19 minutes 23 seconds  Total Procedure Duration: 0 hours 22 minutes 48 seconds  Findings:                 Hemorrhoids were found on perianal exam.                           Two sessile polyps were found in the transverse                            colon and ascending colon. The polyps  were less                            than 1 mm in size. These polyps were removed with a                            cold biopsy forceps. Resection and retrieval were                            complete. Estimated blood loss was minimal.                           Four sessile polyps were found in the sigmoid colon                            and descending colon. The polyps were 2 to 4 mm in                            size. These polyps were removed with a cold snare.                            Resection and retrieval were complete. Estimated                            blood loss was minimal.                           Multiple small rectal hyperplastic polyps were seen                            in the rectum. The exam was otherwise without                            abnormality on direct and retroflexion views. Complications:            No immediate complications. Estimated  blood loss:                            Minimal. Estimated Blood Loss:     Estimated blood loss was minimal. Impression:               - Hemorrhoids found on perianal exam.                           - Non-bleeding external and internal hemorrhoids.                           - Two less than 1 mm polyps in the transverse colon                            and in the ascending colon, removed with a cold                            biopsy forceps. Resected and retrieved.                           -  Four 2 to 4 mm polyps in the sigmoid colon and in                            the descending colon, removed with a cold snare.                            Resected and retrieved.                           - The examination was otherwise normal on direct                            and retroflexion views. Recommendation:           - Patient has a contact number available for                            emergencies. The signs and symptoms of potential                            delayed complications were discussed with the                            patient. Return to normal activities tomorrow.                            Written discharge instructions were provided to the                            patient.                           - Resume regular diet today.                           - High fiber diet recommended. Please use something                            like Metamucil +/- Miralax every day to insure                            soft, bulky stools.                           - Resume Xarelto (rivaroxaban) at prior dose today.                            Refer to managing physician for further adjustment                            of therapy.                           - Surveillance colonoscopy in 3 years if at least 3  polyps are adenomas. Plan colonoscopy in 5 years if                            one or two polyps are adenomas. Thornton Park MD, MD 05/19/2019 1:45:13  PM This report has been signed electronically.

## 2019-05-19 NOTE — Progress Notes (Signed)
Report to PACU, RN, vss, BBS= Clear.  

## 2019-05-19 NOTE — Op Note (Signed)
[facility] Patient Name: [name] Procedure Date: 05/19/2019 12:37 PM MRN: [id number] Endoscopist: Thornton Park MD, MD Age: 48 Referring MD:  Date of Birth: [birth year] Gender: Female Account #: [id number] Procedure:                Upper GI endoscopy Indications:              Abdominal pain in the right upper quadrant                           BC powder use a couple of times weekly for pain Medicines:                See the Anesthesia note for documentation of the                            administered medications Procedure:                Pre-Anesthesia Assessment:                           - Prior to the procedure, a History and Physical                            was performed, and patient medications and                            allergies were reviewed. The patient's tolerance of                            previous anesthesia was also reviewed. The risks                            and benefits of the procedure and the sedation                            options and risks were discussed with the patient.                            All questions were answered, and informed consent                            was obtained. Prior Anticoagulants: The patient has                            taken Xarelto (rivaroxaban), last dose was 2 days                            prior to procedure. ASA Grade Assessment: II - A                            patient with mild systemic disease. After reviewing                            the risks and benefits, the patient was deemed in  satisfactory condition to undergo the procedure.                           After obtaining informed consent, the endoscope was                            passed under direct vision. Throughout the                            procedure, the patient's blood pressure, pulse, and                            oxygen saturations were monitored continuously. The   Endoscope was introduced through the mouth, and                            advanced to the third part of duodenum. The upper                            GI endoscopy was accomplished without difficulty.                            The patient tolerated the procedure well. Scope In: Scope Out: Findings:                 Localized mild mucosal changes characterized by                            congestion were found at the gastroesophageal                            junction. Biopsies were taken with a cold forceps                            for histology.                           Diffuse mild inflammation was found in the gastric                            body and in the gastric antrum. Biopsies were taken                            with a cold forceps for histology. Estimated blood                            loss was minimal.                           Multiple localized, non-bleeding erosions were                            found in the gastric antrum. There were no stigmata  of recent bleeding.                           One non-bleeding superficial gastric ulcer with no                            stigmata of bleeding was found in the gastric                            antrum. The lesion was 4 mm in largest dimension.                           The examined duodenum was normal. Biopsies for                            histology were taken with a cold forceps for                            evaluation of celiac disease. Estimated blood loss                            was minimal.                           A small hiatal hernia was present.                           The exam was otherwise without abnormality. Complications:            No immediate complications. Estimated blood loss:                            Minimal. Estimated Blood Loss:     Estimated blood loss was minimal. Impression:               - Congested mucosa in the esophagus. Biopsied.                            - Gastritis. Biopsied.                           - Non-bleeding erosive gastropathy.                           - Non-bleeding gastric ulcer with no stigmata of                            bleeding.                           - Normal examined duodenum. Biopsied.                           - Small hiatal hernia.                           - The examination was otherwise normal. Recommendation:           -  Patient has a contact number available for                            emergencies. The signs and symptoms of potential                            delayed complications were discussed with the                            patient. Return to normal activities tomorrow.                            Written discharge instructions were provided to the                            patient.                           - Resume regular diet.                           - Continue present medications.                           - Start pantoprazole 40 mg BID x 8 weeks.                           - Avoid all NSAIDs.                           - Await pathology results.                           - Proceed with colonoscopy as previously planned. Thornton Park MD, MD 05/19/2019 1:38:38 PM This report has been signed electronically.

## 2019-05-19 NOTE — Progress Notes (Signed)
[facility]- [place] [facility] VS-Nancy Megan Salon

## 2019-05-19 NOTE — Progress Notes (Signed)
Called to room to assist during endoscopic procedure.  Patient ID and intended procedure confirmed with present staff. Received instructions for my participation in the procedure from the performing physician.  

## 2019-05-21 ENCOUNTER — Telehealth: Payer: Self-pay | Admitting: *Deleted

## 2019-05-21 ENCOUNTER — Telehealth: Payer: Self-pay

## 2019-05-21 ENCOUNTER — Telehealth: Payer: Self-pay | Admitting: Gastroenterology

## 2019-05-21 NOTE — Telephone Encounter (Signed)
Spoke to patient, relayed all of Dr. Donneta Romberg recommendations to the patient who was already at the drug store purchasing the items. Patient told to call back on 8/7 if her symptoms did not improve and to report to the ED for evaluation if she started to experience severe pain.   Dr. Tarri Glenn- The patient is interested in hemorrhoid banding/removal. Is she a banding candidate or should a referral be sent to [facility] for surgical hemorrhoid removal? Please advise.

## 2019-05-21 NOTE — Telephone Encounter (Signed)
NO ANSWER, MESSAGE LEFT FOR PATIENT. 

## 2019-05-21 NOTE — Telephone Encounter (Signed)
She should initiate Tylenol or NSAID today for next few days.   Patient should start Preparation H OTC ointment or cream 2-3 times per day.   She may use a single Preparation H suppository this evening prior to going to bed.   She should get a finger cot/condom to help with placing the Preparation H. Sitz bathes twice daily, todays should be before going to bed. If issues persist into tomorrow then can discuss with Doc of  Day.  May need Anusol suppositories prescribed. If severe pain she can not tolerate then needs to go to ED so that she can be evaluated to be sure no evidence of thrombosis hemorrhoids by ED and/or General surgery.  GM

## 2019-05-21 NOTE — Telephone Encounter (Signed)
Thank you for the follow-up. I recommend a referral to Dr. Nadeen Landau for her hemorrhoids. I hope that she is feeling better today. Thanks.

## 2019-05-21 NOTE — Telephone Encounter (Signed)
No answer for post procedure call back. Left message for patient to call back with questions or concerns. SM 

## 2019-05-21 NOTE — Telephone Encounter (Addendum)
[facility] patient, sent to [facility]-- Patient called, had colon in [place] on 8/4, it was noted on the procedure report that the patient had internal and external hemorrhoids. The patient's external hemorrhoids are causing extreme discomfort, pain and "spasming", especially when walking as the patient walks many miles per day. The patient has not tried anything OTC to help with relief or have been prescribed anything for relief. Please advise.

## 2019-05-22 NOTE — Telephone Encounter (Signed)
Surgical referral sent to [facility].  Spoke to the patient who reported she feels better, she is still experiencing slight discomfort and describes what she finds to be a "hard pimple" just inside of her rectum. The patient was encouraged to follow the recommendations of Dr. Rush Landmark daily and that [facility] will reach out to her to schedule an appointment for a consulation with Dr. Dema Severin.   No additional questions or concerns voiced by the patient at the conclusion of our phone call.

## 2019-05-27 ENCOUNTER — Encounter: Payer: Self-pay | Admitting: Gastroenterology

## 2019-05-28 ENCOUNTER — Other Ambulatory Visit: Payer: Self-pay | Admitting: Gastroenterology

## 2019-06-09 ENCOUNTER — Other Ambulatory Visit: Payer: Self-pay | Admitting: Physician Assistant

## 2019-06-09 DIAGNOSIS — Z1231 Encounter for screening mammogram for malignant neoplasm of breast: Secondary | ICD-10-CM

## 2019-07-06 ENCOUNTER — Telehealth: Payer: Self-pay | Admitting: *Deleted

## 2019-07-06 NOTE — Telephone Encounter (Addendum)
Followed up with [facility], per [facility] patient has an appointment with Dr. Dema Severin for consultation on 9/21 for hemorrhoids.

## 2019-07-24 ENCOUNTER — Other Ambulatory Visit: Payer: Self-pay

## 2019-07-24 ENCOUNTER — Ambulatory Visit
Admission: RE | Admit: 2019-07-24 | Discharge: 2019-07-24 | Disposition: A | Discharge status: Home / Self Care | Payer: Managed Care, Other (non HMO) | Source: Ambulatory Visit | Attending: Physician Assistant | Admitting: Physician Assistant

## 2019-07-24 DIAGNOSIS — Z1231 Encounter for screening mammogram for malignant neoplasm of breast: Secondary | ICD-10-CM

## 2019-08-19 ENCOUNTER — Other Ambulatory Visit: Payer: Self-pay | Admitting: Gastroenterology

## 2019-08-22 ENCOUNTER — Other Ambulatory Visit: Payer: Self-pay | Admitting: Gastroenterology

## 2019-10-12 IMAGING — MG MM DIGITAL SCREENING BILAT W/ TOMO W/ CAD
8 series · 8 of 24 positions shown · non-contrast
Comparison: Previous exam(s).

ACR Breast Density Category a: The breast tissue is almost entirely
fatty.

CLINICAL DATA: Screening.

EXAM:
DIGITAL SCREENING BILATERAL MAMMOGRAM WITH TOMO AND CAD

[L CC synth-2D]
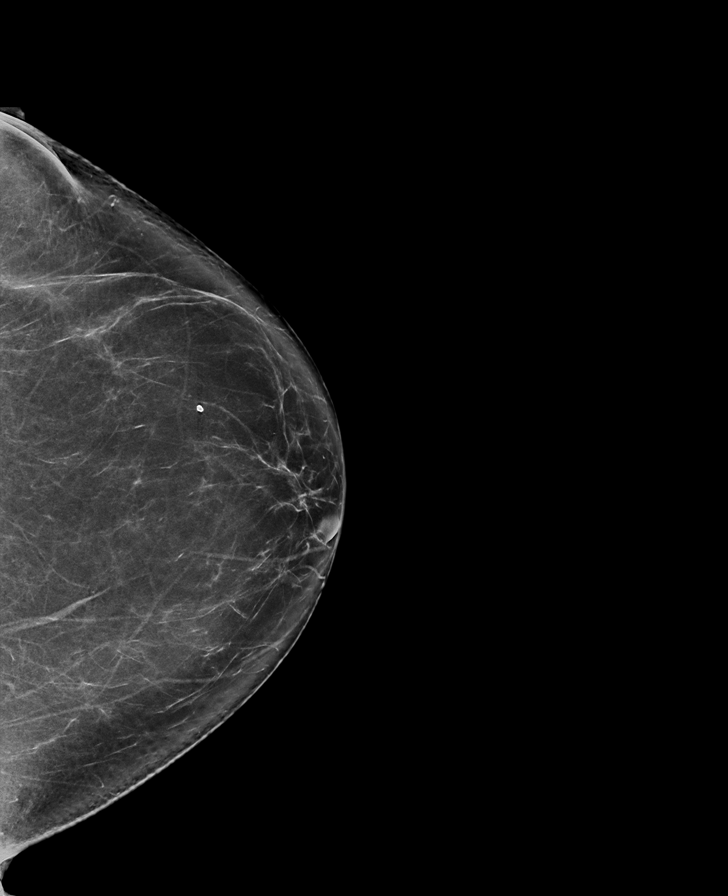

[L MLO synth-2D]
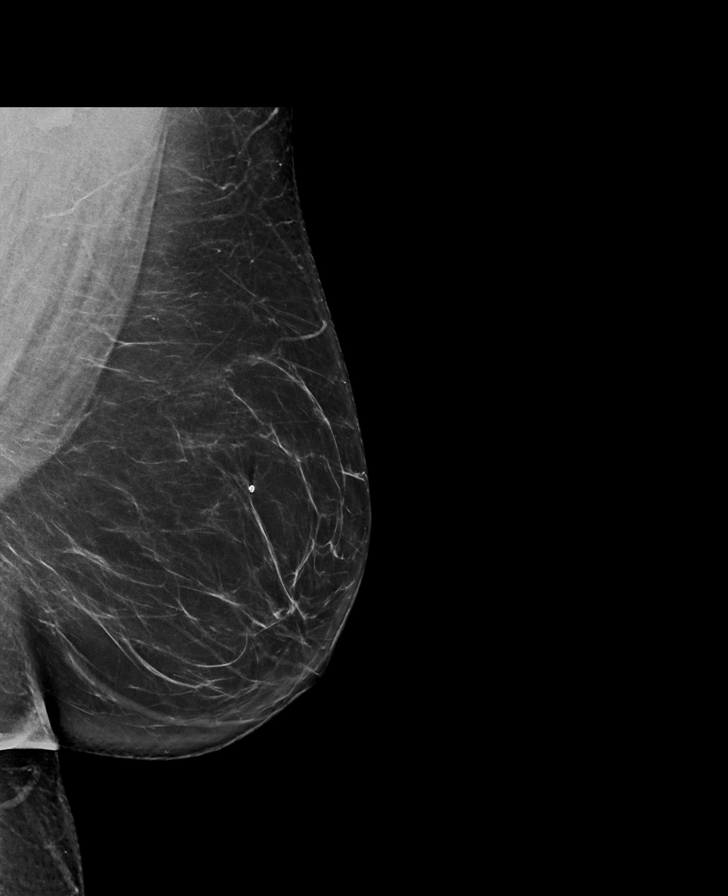

[R MLO synth-2D]
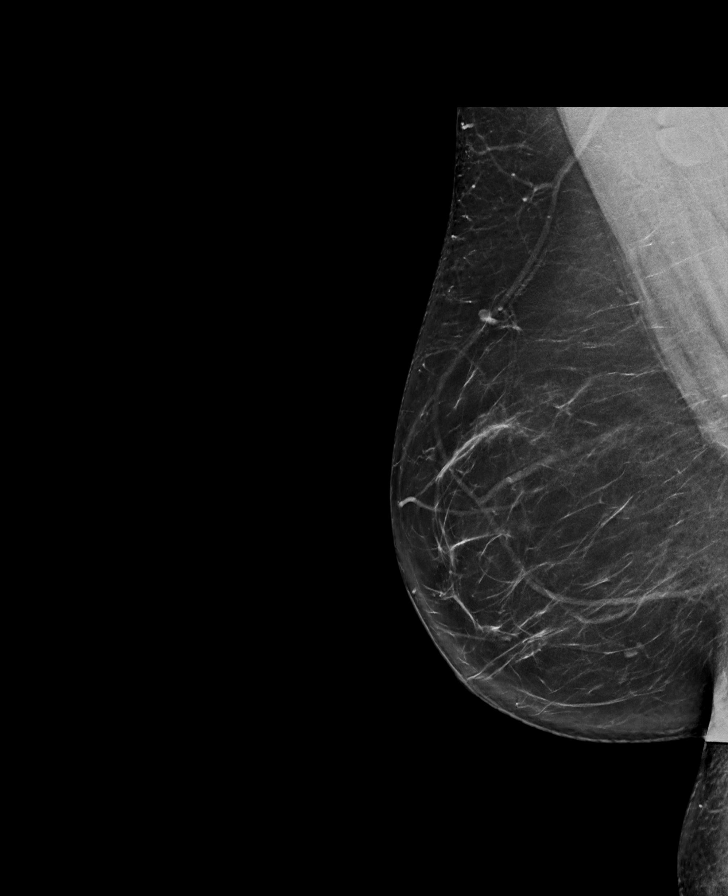

[R CC synth-2D]
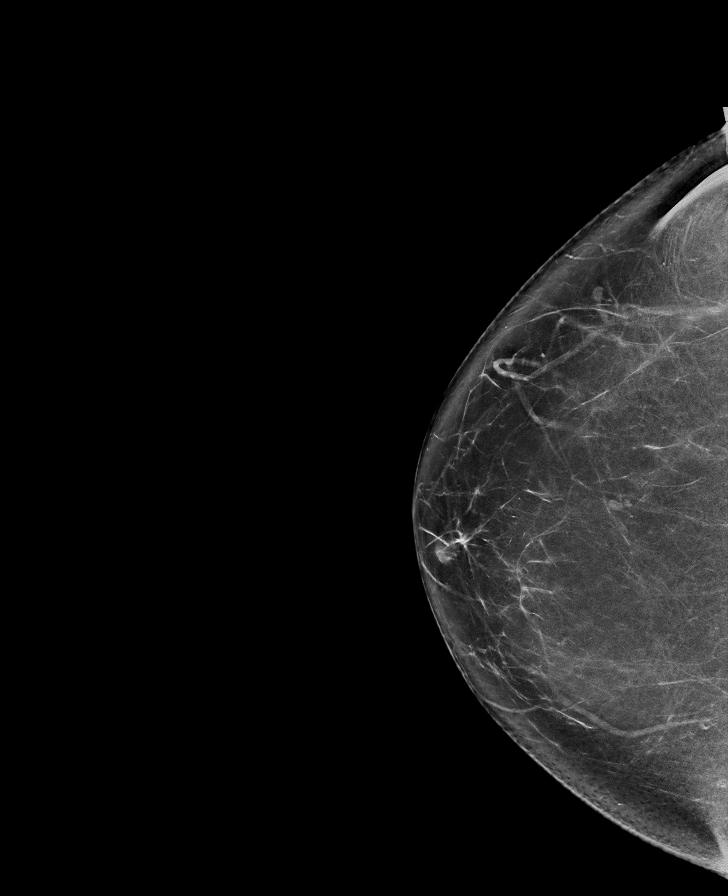

[R CC tomo · tomo slice 43/84.0]
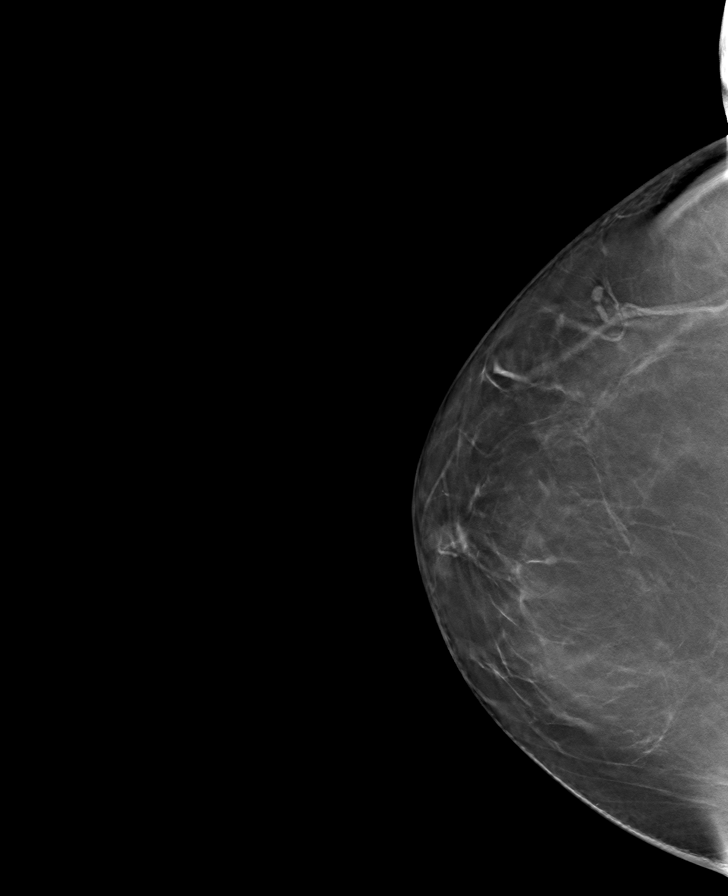

[R MLO tomo · tomo slice 43/84.0]
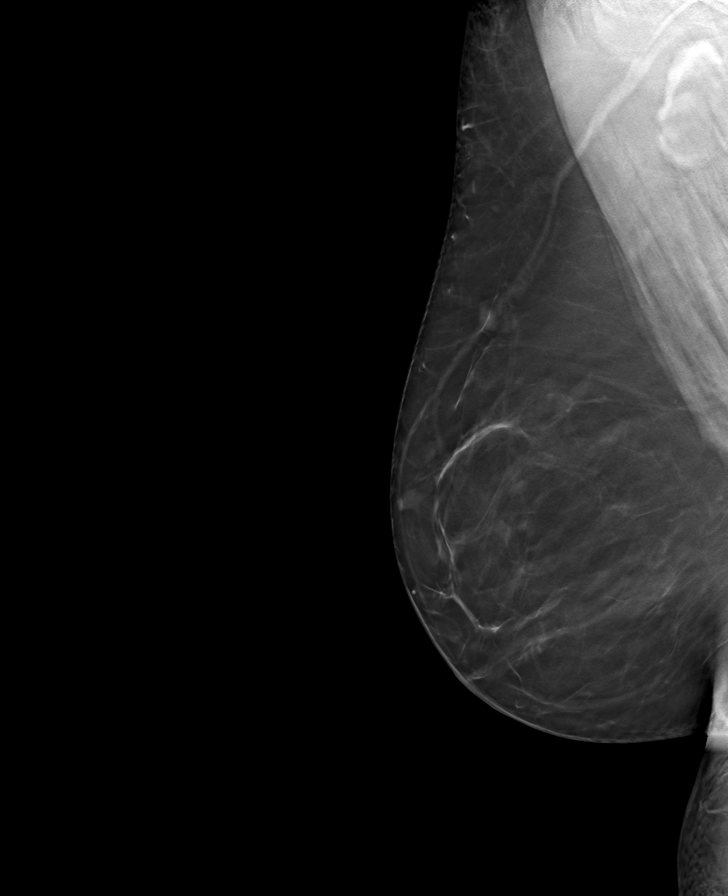

[L MLO tomo · tomo slice 43/85.0]
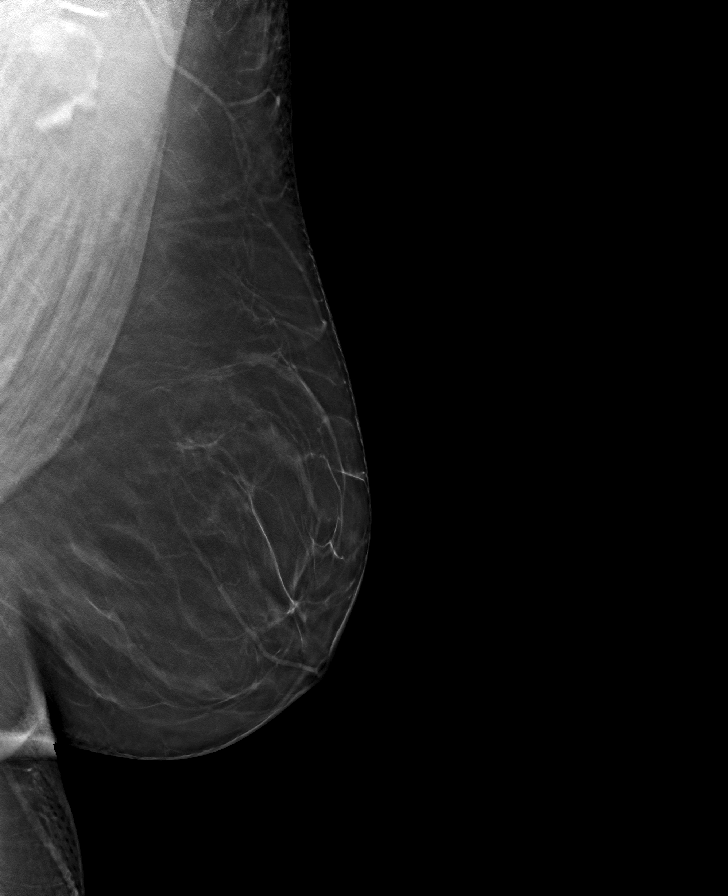

[L CC tomo · tomo slice 41/81.0]
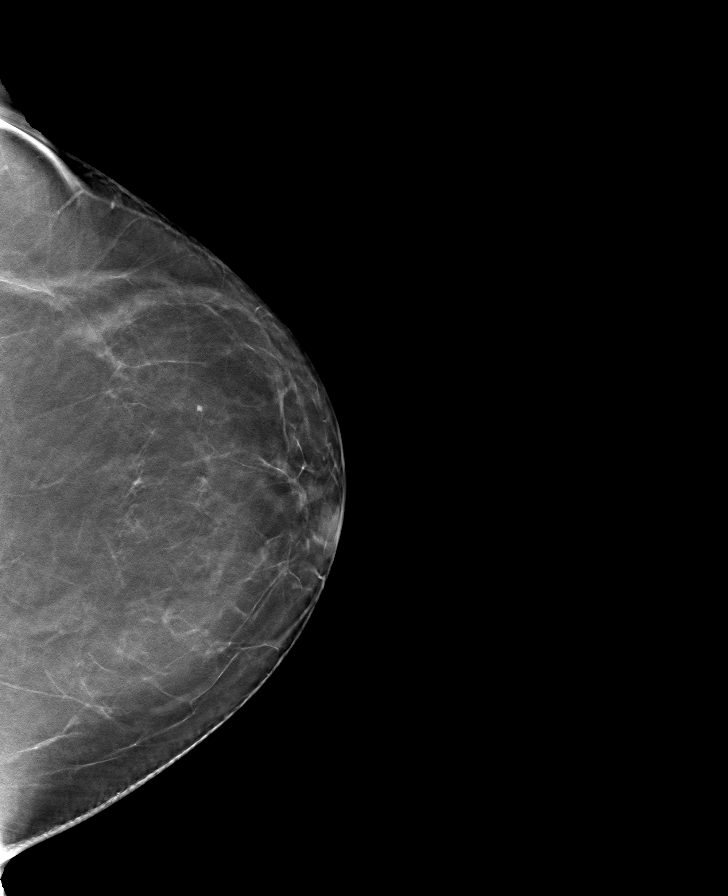

[8 of 24 positions shown; findings below may reference images not displayed]

FINDINGS: There are no findings suspicious for malignancy. Images were
processed with CAD.
IMPRESSION: No mammographic evidence of malignancy. A result letter of this
screening mammogram will be mailed directly to the patient.

RECOMMENDATION:
Screening mammogram in one year. (Code:[id number]-[id number])

BI-RADS CATEGORY  1: Negative.

## 2020-03-15 DIAGNOSIS — M25562 Pain in left knee: Secondary | ICD-10-CM | POA: Insufficient documentation

## 2020-03-22 ENCOUNTER — Ambulatory Visit: Payer: Managed Care, Other (non HMO) | Admitting: Family Medicine

## 2020-03-22 NOTE — Progress Notes (Deleted)
[place] [facility] Phone: [phone] Subjective:    I'm seeing this patient by the request  of:  Cyndi Bender, PA-C  CC:   [id number]  [name] is a 49 y.o. female coming in with complaint of left knee pain. Patient states       Past Medical History:  Diagnosis Date   Anxiety    Arthritis    Asthma    Blood transfusion without reported diagnosis    DVT (deep venous thrombosis) (HCC)    GERD (gastroesophageal reflux disease)    Hyperlipidemia    Sleep apnea    wears CPAP   Past Surgical History:  Procedure Laterality Date   ABDOMINAL HYSTERECTOMY     DILATION AND CURETTAGE OF UTERUS     SHOULDER SURGERY     WRIST SURGERY     Social History   Socioeconomic History   Marital status: Single    Spouse name: Not on file   Number of children: Not on file   Years of education: Not on file   Highest education level: Not on file  Occupational History   Not on file  Tobacco Use   Smoking status: Current Some Day Smoker    Packs/day: 0.50    Years: 20.00    Pack years: 10.00    Types: Cigarettes   Smokeless tobacco: Never Used   Tobacco comment:  Said she may do 1 cig per week  Substance and Sexual Activity   Alcohol use: No   Drug use: No   Sexual activity: Not on file  Other Topics Concern   Not on file  Social History Narrative   Not on file   Social Determinants of Health   Financial Resource Strain:    Difficulty of Paying Living Expenses:   Food Insecurity:    Worried About Charity fundraiser in the Last Year:    Arboriculturist in the Last Year:   Transportation Needs:    Film/video editor (Medical):    Lack of Transportation (Non-Medical):   Physical Activity:    Days of Exercise per Week:    Minutes of Exercise per Session:   Stress:    Feeling of Stress :   Social Connections:    Frequency of Communication with Friends and  Family:    Frequency of Social Gatherings with Friends and Family:    Attends Religious Services:    Active Member of Clubs or Organizations:    Attends Music therapist:    Marital Status:    No Known Allergies Family History  Problem Relation Age of Onset   Deep vein thrombosis Mother    Other Mother        varicose veins   Colon polyps Mother        not cancerous   Colon polyps Father        said he had bowel problems, precancerous   COPD Father    Hyperlipidemia Brother    Hypertension Brother    Heart attack Brother    Other Sister        sad sister had bowel problems   Lung cancer Maternal Aunt    Colon cancer Neg Hx    Esophageal cancer Neg Hx    Rectal cancer Neg Hx    Stomach cancer Neg Hx       Current Outpatient Medications (Cardiovascular):    pravastatin (PRAVACHOL) 40 MG tablet,  Take 40 mg by mouth daily.   Current Outpatient Medications (Respiratory):    albuterol (PROVENTIL HFA;VENTOLIN HFA) 108 (90 BASE) MCG/ACT inhaler, Inhale 2 puffs into the lungs every 6 (six) hours as needed for wheezing.   Current Outpatient Medications (Analgesics):    oxyCODONE-acetaminophen (PERCOCET) 10-650 MG per tablet, Take 1 tablet by mouth every 12 (twelve) hours as needed. pain   Current Outpatient Medications (Hematological):    rivaroxaban (XARELTO) 20 MG TABS tablet, Take 20 mg by mouth daily with supper.   Current Outpatient Medications (Other):    citalopram (CELEXA) 40 MG tablet, Take 40 mg by mouth daily.   Docusate Sodium (STOOL SOFTENER LAXATIVE PO), Take 1 tablet by mouth daily as needed.   omeprazole (PRILOSEC) 20 MG capsule, Take 20 mg by mouth 2 (two) times daily.    ondansetron (ZOFRAN-ODT) 4 MG disintegrating tablet, DISSOLVE ONE TABLET BY MOUTH EVERY 6 HOURS AS NEEDED FOR NAUSEA AND VOMITING   pantoprazole (PROTONIX) 40 MG tablet, TAKE ONE TABLET BY MOUTH TWICE A DAY   Probiotic Product (PROBIOTIC DAILY  PO), Take 1 capsule by mouth daily.   varenicline (CHANTIX) 1 MG tablet, 1 tablet 2 (two) times daily.   zolpidem (AMBIEN) 10 MG tablet, Take 10 mg by mouth at bedtime as needed. Sleep  Current Facility-Administered Medications (Other):    0.9 %  sodium chloride infusion   Reviewed prior external information including notes and imaging from  primary care provider As well as notes that were available from care everywhere and other healthcare systems.  Past medical history, social, surgical and family history all reviewed in electronic medical record.  No pertanent information unless stated regarding to the chief complaint.   Review of Systems:  No headache, visual changes, nausea, vomiting, diarrhea, constipation, dizziness, abdominal pain, skin rash, fevers, chills, night sweats, weight loss, swollen lymph nodes, body aches, joint swelling, chest pain, shortness of breath, mood changes. POSITIVE muscle aches  Objective  There were no vitals taken for this visit.   General: No apparent distress alert and oriented x3 mood and affect normal, dressed appropriately.  HEENT: Pupils equal, extraocular movements intact  Respiratory: Patient's speak in full sentences and does not appear short of breath  Cardiovascular: No lower extremity edema, non tender, no erythema  Neuro: Cranial nerves II through XII are intact, neurovascularly intact in all extremities with 2+ DTRs and 2+ pulses.  Gait normal with good balance and coordination.  MSK:  Non tender with full range of motion and good stability and symmetric strength and tone of shoulders, elbows, wrist, hip, knee and ankles bilaterally.     Impression and Recommendations:     The above documentation has been reviewed and is accurate and complete Lyndal Pulley, DO       Note: This dictation was prepared with Dragon dictation along with smaller phrase technology. Any transcriptional errors that result from this process are  unintentional.

## 2020-04-11 ENCOUNTER — Other Ambulatory Visit: Payer: Self-pay | Admitting: Gastroenterology

## 2020-04-14 ENCOUNTER — Other Ambulatory Visit: Payer: Self-pay | Admitting: Gastroenterology

## 2020-06-15 ENCOUNTER — Other Ambulatory Visit: Payer: Self-pay | Admitting: Physician Assistant

## 2020-06-15 DIAGNOSIS — Z1231 Encounter for screening mammogram for malignant neoplasm of breast: Secondary | ICD-10-CM

## 2020-06-25 ENCOUNTER — Other Ambulatory Visit: Payer: Self-pay | Admitting: Gastroenterology

## 2020-07-18 ENCOUNTER — Other Ambulatory Visit: Payer: Self-pay | Admitting: Gastroenterology

## 2020-07-25 ENCOUNTER — Ambulatory Visit: Payer: Managed Care, Other (non HMO)

## 2020-08-05 ENCOUNTER — Encounter (HOSPITAL_BASED_OUTPATIENT_CLINIC_OR_DEPARTMENT_OTHER): Payer: Self-pay | Admitting: Orthopedic Surgery

## 2020-08-09 ENCOUNTER — Encounter (HOSPITAL_BASED_OUTPATIENT_CLINIC_OR_DEPARTMENT_OTHER): Payer: Self-pay | Admitting: Orthopedic Surgery

## 2020-08-10 ENCOUNTER — Encounter (HOSPITAL_BASED_OUTPATIENT_CLINIC_OR_DEPARTMENT_OTHER): Payer: Self-pay | Admitting: Orthopedic Surgery

## 2020-08-10 ENCOUNTER — Other Ambulatory Visit: Payer: Self-pay

## 2020-08-10 NOTE — Progress Notes (Addendum)
ADDENDUM:  Received pt's pcp xarelto clearance via fax from Dr Alvan Dame office.  Placed with chart. Called and spoke w/ pt via phone she stated that she called her pcp office and  was given instructions  to not take day before surgery , day of surgery, and start back day after surgery.   Spoke w/ via phone for pre-op interview--- PT Lab needs dos----  no             Lab results------ no COVID test ------ 08-12-2020 @ 9629 Arrive at ------- 0615 NPO after MN NO Solid Food.  Clear liquids from MN until--- 0515 Medications to take morning of surgery ----- Protonix, Prilosec,  Diabetic medication ----- n/a Patient Special Instructions ----- asked to bring rescue inhaler and cpap/ mask/ tubing dos Pre-Op special Istructions -----  Called Dr Alvan Dame office and requested pt's pcp clearance for Xarelto and let them know pt stated she was not  given any instructions stop or not. Patient verbalized understanding of instructions that were given at this phone interview. Patient denies shortness of breath, chest pain, fever, cough at this phone interview.   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  OSA w/ CPAP,  History wheezing (stated is has been long time since used rescue inhaler), GERD.  PCP:  Cyndi Bender PA Sleep Study/ CPAP :  YES/  Per states uses average 5 times weekly Fasting Blood Sugar :      / Checks Blood Sugar -- times a day:   n/a Blood Thinner/ Instructions /Last Dose: Xarelto ASA / Instructions/ Last Dose :  NO Per pt has not been given any instructions to stop xarelto or not.  Advised pt to call her pcp and get instructions on when to stopped for surgery.   Clearance had been requested from Dr Alvan Dame office.

## 2020-08-12 ENCOUNTER — Other Ambulatory Visit (HOSPITAL_COMMUNITY)
Admission: RE | Admit: 2020-08-12 | Discharge: 2020-08-12 | Disposition: A | Discharge status: Home / Self Care | Payer: Managed Care, Other (non HMO) | Source: Ambulatory Visit | Attending: Orthopedic Surgery | Admitting: Orthopedic Surgery

## 2020-08-12 ENCOUNTER — Ambulatory Visit: Payer: Managed Care, Other (non HMO) | Admitting: Nurse Practitioner

## 2020-08-12 DIAGNOSIS — Z01812 Encounter for preprocedural laboratory examination: Secondary | ICD-10-CM | POA: Insufficient documentation

## 2020-08-12 DIAGNOSIS — Z20822 Contact with and (suspected) exposure to covid-19: Secondary | ICD-10-CM | POA: Diagnosis not present

## 2020-08-13 LAB — SARS CORONAVIRUS 2 (TAT 6-24 HRS): SARS Coronavirus 2: NEGATIVE

## 2020-08-15 NOTE — H&P (Signed)
CC- [name] is a 49 y.o. female who presents with left knee pain.  HPI- . Knee Pain: Patient presents for follow up on a knee problem involving the  left knee. Onset of the symptoms was several months ago. Inciting event: none known. Current symptoms include giving out, pain located on medial aspect of joint and stiffness. Pain is aggravated by lateral movements, pivoting and walking.  Patient has had no prior knee problems. Evaluation to date: plain films: mild to moderate medial joint space narrowing and MRI: radial tear to medial meniscus as well as grade III-IV chondromalacia involving medial compartment. Treatment to date: avoidance of offending activity, corticosteroid injection which was somewhat effective, OTC analgesics which are not very effective, prescription NSAIDS which are not very effective and rest.  Past Medical History:  Diagnosis Date  . Acute meniscal tear of left knee   . Anticoagulant long-term use    xarelto--- managed by pcp  . Anxiety   . Family history of adverse reaction to anesthesia    mother--- ponv  . GERD (gastroesophageal reflux disease)   . Hiatal hernia   . 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 History of gastritis 05/2019   peptic duodenitis  . History of kidney stones   . Hyperlipidemia   . Nephrolithiasis   . OA (osteoarthritis)   . OSA on CPAP    08-10-2020 per pt uses 5 times weekly  . Wears glasses   . Wears partial dentures     Past Surgical History:  Procedure Laterality Date  . AXILLARY SURGERY Left 09-13-2000  @[facility]    excision left axilla stem  . COLONOSCOPY WITH ESOPHAGOGASTRODUODENOSCOPY (EGD)  05-19-2019   dr Tarri Glenn  . CYSTOSCOPY W/ URETERAL STENT PLACEMENT  12-09-2001  @[facility]   . CYSTOSCOPY/RETROGRADE/URETEROSCOPY/STONE EXTRACTION WITH BASKET  03-08-2000 @ [facility]  . DX LAPAROSCOPY W/ LYSIS ADHESIONS AND DILATATION /  CURRETTAGE  08-11-2003  @[facility]   . LAPAROSCOPY W/ LYSIS ADHESIONS/ UTEROSACRAL NERVE LIGATION / DRAINAGE LEFT OVARIAN CYST  07-13-2002  @[facility]   . SHOULDER ARTHROSCOPY Left 2010  . TOE SURGERY Right 2016   removal spur great toe  . TOTAL ABDOMINAL HYSTERECTOMY W/ BILATERAL SALPINGOOPHORECTOMY  10-26-2003  @[facility]   . WRIST SURGERY Right child   complex laceration    Prior to Admission medications   Medication Sig Start Date End Date Taking? Authorizing Provider  albuterol (PROVENTIL HFA;VENTOLIN HFA) 108 (90 BASE) MCG/ACT inhaler Inhale 2 puffs into the lungs every 6 (six) hours as needed for wheezing.   Yes [provider]  Ascorbic Acid (VITAMIN C PO) Take by mouth daily.   Yes [provider]  Cholecalciferol (VITAMIN D3 PO) Take by mouth daily.   Yes [provider]  citalopram (CELEXA) 40 MG tablet Take 40 mg by mouth at bedtime.    Yes [provider]  Cyanocobalamin (B-12 PO) Take by mouth daily.   Yes [provider]  Docusate Sodium (STOOL SOFTENER LAXATIVE PO) Take 1 tablet by mouth daily as needed.   Yes [provider]  Multiple Vitamins-Minerals (ZINC PO) Take by mouth daily.   Yes [provider]  omeprazole (PRILOSEC) 40 MG capsule Take 40 mg by mouth daily.   Yes [provider]  oxyCODONE-acetaminophen (PERCOCET) 10-650 MG per tablet Take 1 tablet by mouth every 12 (twelve) hours as needed. pain   Yes [provider]  pantoprazole (PROTONIX) 20 MG tablet Take 20 mg by mouth 2 (two) times daily.   Yes [provider]  pravastatin (PRAVACHOL) 40 MG tablet Take 40 mg by mouth at bedtime.    Yes [provider]  rivaroxaban (XARELTO) 20 MG TABS tablet Take 20 mg by mouth daily with supper.   Yes [provider]  varenicline (CHANTIX) 1 MG tablet 1 tablet 2 (two) times daily.   Yes [provider]  zolpidem (AMBIEN) 10 MG tablet Take 10 mg by mouth at bedtime as needed. Sleep    Yes [provider]    soft tissue tenderness over medial joint line, effusion, negative drawer sign, collateral ligaments intact, normal ipsilateral hip exam, normal contralateral knee exam  Physical Examination: General appearance - alert, well appearing, and in no distress Mental status - alert, oriented to person, place, and time Chest - no tachypnea, retractions or cyanosis Heart - normal rate and regular rhythm Abdomen - soft, nontender, nondistended, no masses or organomegaly Neurological - alert, oriented, normal speech, no focal findings or movement disorder noted Musculoskeletal - see above Extremities - peripheral pulses normal, no pedal edema, no clubbing or cyanosis Skin - normal coloration and turgor, no rashes, no suspicious skin lesions noted   Asessment/Plan--- Left knee medial meniscal tear associated with moderate chondromalacia  Plan left knee arthroscopy with meniscal debridement and chondroplasty. Procedure risks and potential comps discussed with patient who elects to proceed. Goals are decreased pain and increased function with a high likelihood of achieving both

## 2020-08-16 ENCOUNTER — Encounter (HOSPITAL_BASED_OUTPATIENT_CLINIC_OR_DEPARTMENT_OTHER)
Admission: RE | Disposition: A | Discharge status: Home / Self Care | Payer: Self-pay | Source: Home / Self Care | Attending: Orthopedic Surgery

## 2020-08-16 ENCOUNTER — Encounter (HOSPITAL_BASED_OUTPATIENT_CLINIC_OR_DEPARTMENT_OTHER): Payer: Self-pay | Admitting: Orthopedic Surgery

## 2020-08-16 ENCOUNTER — Ambulatory Visit (HOSPITAL_BASED_OUTPATIENT_CLINIC_OR_DEPARTMENT_OTHER)
Admission: RE | Admit: 2020-08-16 | Discharge: 2020-08-16 | Disposition: A | Discharge status: Home / Self Care | Payer: Managed Care, Other (non HMO) | Attending: Orthopedic Surgery | Admitting: Orthopedic Surgery

## 2020-08-16 ENCOUNTER — Ambulatory Visit (HOSPITAL_BASED_OUTPATIENT_CLINIC_OR_DEPARTMENT_OTHER): Payer: Managed Care, Other (non HMO) | Admitting: Anesthesiology

## 2020-08-16 ENCOUNTER — Other Ambulatory Visit: Payer: Self-pay | Admitting: Gastroenterology

## 2020-08-16 DIAGNOSIS — Z9889 Other specified postprocedural states: Secondary | ICD-10-CM

## 2020-08-16 DIAGNOSIS — F17210 Nicotine dependence, cigarettes, uncomplicated: Secondary | ICD-10-CM | POA: Insufficient documentation

## 2020-08-16 DIAGNOSIS — S83242A Other tear of medial meniscus, current injury, left knee, initial encounter: Secondary | ICD-10-CM | POA: Insufficient documentation

## 2020-08-16 DIAGNOSIS — X58XXXA Exposure to other specified factors, initial encounter: Secondary | ICD-10-CM | POA: Diagnosis not present

## 2020-08-16 DIAGNOSIS — Z6833 Body mass index (BMI) 33.0-33.9, adult: Secondary | ICD-10-CM | POA: Diagnosis not present

## 2020-08-16 DIAGNOSIS — E669 Obesity, unspecified: Secondary | ICD-10-CM | POA: Diagnosis not present

## 2020-08-16 DIAGNOSIS — M94262 Chondromalacia, left knee: Secondary | ICD-10-CM | POA: Diagnosis not present

## 2020-08-16 DIAGNOSIS — S83242D Other tear of medial meniscus, current injury, left knee, subsequent encounter: Secondary | ICD-10-CM

## 2020-08-16 HISTORY — DX: Presence of spectacles and contact lenses: Z97.3

## 2020-08-16 HISTORY — DX: Family history of other specified conditions: Z84.89

## 2020-08-16 HISTORY — DX: Presence of dental prosthetic device (complete) (partial): Z97.2

## 2020-08-16 HISTORY — DX: Personal history of urinary calculi: Z87.442

## 2020-08-16 HISTORY — DX: Diaphragmatic hernia without obstruction or gangrene: K44.9

## 2020-08-16 HISTORY — DX: Calculus of kidney: N20.0

## 2020-08-16 HISTORY — DX: Unspecified tear of unspecified meniscus, current injury, left knee, initial encounter: S83.207A

## 2020-08-16 HISTORY — DX: Long term (current) use of anticoagulants: Z79.01

## 2020-08-16 HISTORY — DX: Unspecified osteoarthritis, unspecified site: M19.90

## 2020-08-16 HISTORY — PX: KNEE ARTHROSCOPY WITH MEDIAL MENISECTOMY: SHX5651

## 2020-08-16 HISTORY — DX: Obstructive sleep apnea (adult) (pediatric): G47.33

## 2020-08-16 HISTORY — DX: Personal history of other venous thrombosis and embolism: Z86.718

## 2020-08-16 LAB — POCT PREGNANCY, URINE: Preg Test, Ur: NEGATIVE

## 2020-08-16 SURGERY — ARTHROSCOPY, KNEE, WITH MEDIAL MENISCECTOMY
Anesthesia: Regional | Site: Knee | Laterality: Left

## 2020-08-16 MED ORDER — BUPIVACAINE-EPINEPHRINE 0.25% -1:200000 IJ SOLN
INTRAMUSCULAR | Status: DC | PRN
  Administered 2020-08-16: 20 mL

## 2020-08-16 MED ORDER — DEXAMETHASONE SODIUM PHOSPHATE 10 MG/ML IJ SOLN
INTRAMUSCULAR | Status: DC | PRN
  Administered 2020-08-16: 10 mg

## 2020-08-16 MED ORDER — OXYCODONE HCL 5 MG PO TABS
ORAL_TABLET | ORAL | Status: AC
  Filled 2020-08-16: qty 1

## 2020-08-16 MED ORDER — CEFAZOLIN SODIUM-DEXTROSE 2-4 GM/100ML-% IV SOLN
INTRAVENOUS | Status: AC
  Filled 2020-08-16: qty 100

## 2020-08-16 MED ORDER — ONDANSETRON HCL 4 MG/2ML IJ SOLN
INTRAMUSCULAR | Status: DC | PRN
  Administered 2020-08-16: 4 mg via INTRAVENOUS

## 2020-08-16 MED ORDER — FENTANYL CITRATE (PF) 100 MCG/2ML IJ SOLN
INTRAMUSCULAR | Status: AC
  Filled 2020-08-16: qty 2

## 2020-08-16 MED ORDER — FENTANYL CITRATE (PF) 100 MCG/2ML IJ SOLN
50.0000 ug | Freq: Once | INTRAMUSCULAR | Status: AC
  Administered 2020-08-16: 50 ug via INTRAVENOUS

## 2020-08-16 MED ORDER — OXYCODONE HCL 5 MG PO TABS
10.0000 mg | ORAL_TABLET | Freq: Four times a day (QID) | ORAL | 0 refills | Status: AC | PRN
Start: 2020-08-16 — End: 2021-08-16

## 2020-08-16 MED ORDER — LIDOCAINE 2% (20 MG/ML) 5 ML SYRINGE
INTRAMUSCULAR | Status: AC
  Filled 2020-08-16: qty 5

## 2020-08-16 MED ORDER — SODIUM CHLORIDE 0.9 % IR SOLN
Status: DC | PRN
  Administered 2020-08-16: 3000 mL

## 2020-08-16 MED ORDER — MIDAZOLAM HCL 2 MG/2ML IJ SOLN
INTRAMUSCULAR | Status: AC
  Filled 2020-08-16: qty 2

## 2020-08-16 MED ORDER — DEXAMETHASONE SODIUM PHOSPHATE 10 MG/ML IJ SOLN
INTRAMUSCULAR | Status: AC
  Filled 2020-08-16: qty 1

## 2020-08-16 MED ORDER — ACETAMINOPHEN 500 MG PO TABS
1000.0000 mg | ORAL_TABLET | Freq: Once | ORAL | Status: AC
  Administered 2020-08-16: 1000 mg via ORAL

## 2020-08-16 MED ORDER — HYDROMORPHONE HCL 1 MG/ML IJ SOLN: 0.2500 mg | INTRAMUSCULAR | Status: DC | PRN

## 2020-08-16 MED ORDER — OXYCODONE HCL 5 MG PO TABS
5.0000 mg | ORAL_TABLET | Freq: Once | ORAL | Status: AC | PRN
  Administered 2020-08-16: 5 mg via ORAL

## 2020-08-16 MED ORDER — EPHEDRINE SULFATE 50 MG/ML IJ SOLN
INTRAMUSCULAR | Status: DC | PRN
  Administered 2020-08-16: 5 mg via INTRAVENOUS
  Administered 2020-08-16 (×3): 10 mg via INTRAVENOUS
  Administered 2020-08-16: 5 mg via INTRAVENOUS
  Administered 2020-08-16: 10 mg via INTRAVENOUS

## 2020-08-16 MED ORDER — OXYCODONE HCL 5 MG/5ML PO SOLN: 5.0000 mg | Freq: Once | ORAL | Status: AC | PRN

## 2020-08-16 MED ORDER — LACTATED RINGERS IV SOLN: INTRAVENOUS | Status: DC

## 2020-08-16 MED ORDER — PROPOFOL 10 MG/ML IV BOLUS
INTRAVENOUS | Status: AC
  Filled 2020-08-16: qty 20

## 2020-08-16 MED ORDER — CEFAZOLIN SODIUM-DEXTROSE 2-4 GM/100ML-% IV SOLN
2.0000 g | INTRAVENOUS | Status: AC
  Administered 2020-08-16: 2 g via INTRAVENOUS

## 2020-08-16 MED ORDER — PROPOFOL 10 MG/ML IV BOLUS
INTRAVENOUS | Status: DC | PRN
  Administered 2020-08-16: 150 mg via INTRAVENOUS

## 2020-08-16 MED ORDER — KETOROLAC TROMETHAMINE 30 MG/ML IJ SOLN: 30.0000 mg | Freq: Once | INTRAMUSCULAR | Status: DC | PRN

## 2020-08-16 MED ORDER — LIDOCAINE 2% (20 MG/ML) 5 ML SYRINGE
INTRAMUSCULAR | Status: DC | PRN
  Administered 2020-08-16: 60 mg via INTRAVENOUS

## 2020-08-16 MED ORDER — MIDAZOLAM HCL 2 MG/2ML IJ SOLN
2.0000 mg | Freq: Once | INTRAMUSCULAR | Status: AC
  Administered 2020-08-16: 2 mg via INTRAVENOUS

## 2020-08-16 MED ORDER — ACETAMINOPHEN 500 MG PO TABS
ORAL_TABLET | ORAL | Status: AC
  Filled 2020-08-16: qty 2

## 2020-08-16 MED ORDER — FENTANYL CITRATE (PF) 100 MCG/2ML IJ SOLN
INTRAMUSCULAR | Status: DC | PRN
  Administered 2020-08-16 (×2): 50 ug via INTRAVENOUS

## 2020-08-16 MED ORDER — DEXAMETHASONE SODIUM PHOSPHATE 10 MG/ML IJ SOLN
INTRAMUSCULAR | Status: DC | PRN
  Administered 2020-08-16: 10 mg via INTRAVENOUS

## 2020-08-16 MED ORDER — PROMETHAZINE HCL 25 MG/ML IJ SOLN: 6.2500 mg | INTRAMUSCULAR | Status: DC | PRN

## 2020-08-16 MED ORDER — ONDANSETRON HCL 4 MG/2ML IJ SOLN
INTRAMUSCULAR | Status: AC
  Filled 2020-08-16: qty 2

## 2020-08-16 MED ORDER — MEPERIDINE HCL 25 MG/ML IJ SOLN: 6.2500 mg | INTRAMUSCULAR | Status: DC | PRN

## 2020-08-16 MED ORDER — DEXAMETHASONE SODIUM PHOSPHATE 10 MG/ML IJ SOLN: 10.0000 mg | Freq: Once | INTRAMUSCULAR | Status: DC

## 2020-08-16 MED ORDER — LIDOCAINE-EPINEPHRINE 1 %-1:100000 IJ SOLN
INTRAMUSCULAR | Status: DC | PRN
  Administered 2020-08-16: 20 mL

## 2020-08-16 MED ORDER — ROPIVACAINE HCL 5 MG/ML IJ SOLN
INTRAMUSCULAR | Status: DC | PRN
  Administered 2020-08-16: 30 mL via EPIDURAL

## 2020-08-16 SURGICAL SUPPLY — 27 items
BNDG ELASTIC 6X5.8 VLCR STR LF (GAUZE/BANDAGES/DRESSINGS) ×3 IMPLANT
COVER WAND RF STERILE (DRAPES) ×3 IMPLANT
DISSECTOR 4.0MM X 13CM (MISCELLANEOUS) ×3 IMPLANT
DRAPE ARTHROSCOPY W/POUCH 114 (DRAPES) ×3 IMPLANT
DRAPE U-SHAPE 47X51 STRL (DRAPES) ×3 IMPLANT
DRSG ADAPTIC 3X8 NADH LF (GAUZE/BANDAGES/DRESSINGS) ×3 IMPLANT
DRSG EMULSION OIL 3X3 NADH (GAUZE/BANDAGES/DRESSINGS) ×3 IMPLANT
DURAPREP 26ML APPLICATOR (WOUND CARE) ×3 IMPLANT
GAUZE SPONGE 4X4 12PLY STRL (GAUZE/BANDAGES/DRESSINGS) ×3 IMPLANT
GAUZE SPONGE 4X4 12PLY STRL LF (GAUZE/BANDAGES/DRESSINGS) ×3 IMPLANT
GLOVE BIO SURGEON STRL SZ7.5 (GLOVE) ×3 IMPLANT
GLOVE BIOGEL PI IND STRL 7.5 (GLOVE) ×2 IMPLANT
GLOVE BIOGEL PI INDICATOR 7.5 (GLOVE) ×4
GOWN STRL REUS W/TWL LRG LVL3 (GOWN DISPOSABLE) ×6 IMPLANT
KIT TURNOVER CYSTO (KITS) ×3 IMPLANT
MANIFOLD NEPTUNE II (INSTRUMENTS) ×3 IMPLANT
NS IRRIG 500ML POUR BTL (IV SOLUTION) ×3 IMPLANT
PACK ARTHROSCOPY DSU (CUSTOM PROCEDURE TRAY) ×3 IMPLANT
PACK BASIN DAY SURGERY FS (CUSTOM PROCEDURE TRAY) ×3 IMPLANT
PADDING CAST COTTON 6X4 STRL (CAST SUPPLIES) ×3 IMPLANT
PROBE BIPOLAR ATHRO 135MM 90D (MISCELLANEOUS) IMPLANT
SUT ETHILON 4 0 PS 2 18 (SUTURE) ×3 IMPLANT
SYR 30ML LL (SYRINGE) ×3 IMPLANT
TOWEL OR 17X26 10 PK STRL BLUE (TOWEL DISPOSABLE) ×3 IMPLANT
TUBE CONNECTING 12'X1/4 (SUCTIONS) ×1
TUBE CONNECTING 12X1/4 (SUCTIONS) ×2 IMPLANT
TUBING ARTHRO INFLOW-ONLY STRL (TUBING) ×3 IMPLANT

## 2020-08-16 NOTE — Brief Op Note (Signed)
08/16/2020  11:46 AM  PATIENT:  Rayelle Haynes Hoehn  49 y.o. female  PRE-OPERATIVE DIAGNOSIS:  Left knee medial meniscal tear, patellofemoral chondromalacia  POST-OPERATIVE DIAGNOSIS:  Left knee medial meniscal tear, patellofemoral chondromalacia  PROCEDURE:  Procedure(s) with comments: LEFT KNEE ARTHROSCOPY WITH PARTIAL MEDIAL MENISECTOMY AND MEDIAL PATELLOFEMORAL CHONDROPLASTY (Left) - 45 mins   See dictated note for procedures  SURGEON:  Surgeon(s) and Role:    Paralee Cancel, MD - Primary  PHYSICIAN ASSISTANT: None  ANESTHESIA:   regional and general  EBL:  5 mL   BLOOD ADMINISTERED:none  DRAINS: none   LOCAL MEDICATIONS USED:  MARCAINE     SPECIMEN:  No Specimen  DISPOSITION OF SPECIMEN:  N/A  COUNTS:  YES  TOURNIQUET:  * No tourniquets in log *  DICTATION: .Other Dictation: Dictation Number [id number]  PLAN OF CARE: Discharge to home after PACU  PATIENT DISPOSITION:  PACU - hemodynamically stable.   Delay start of Pharmacological VTE agent (>24hrs) due to surgical blood loss or risk of bleeding: no

## 2020-08-16 NOTE — Anesthesia Preprocedure Evaluation (Addendum)
Anesthesia Evaluation  Patient identified by MRN, date of birth, ID band Patient awake    Reviewed: Allergy & Precautions, NPO status , Patient's Chart, lab work & pertinent test results  Airway Mallampati: II  TM Distance: >3 FB Neck ROM: Full    Dental  (+) Missing,    Pulmonary sleep apnea and Continuous Positive Airway Pressure Ventilation , Current Smoker,  Current smoker, down to one cigg/d   Pulmonary exam normal breath sounds clear to auscultation       Cardiovascular + DVT (2012 and 2014, now on xarelto; neg hypercoag w/u)  Normal cardiovascular exam Rhythm:Regular Rate:Normal     Neuro/Psych PSYCHIATRIC DISORDERS Anxiety negative neurological ROS     GI/Hepatic Neg liver ROS, hiatal hernia, GERD  Medicated and Controlled,  Endo/Other  Obesity BMI 33  Renal/GU negative Renal ROS  negative genitourinary   Musculoskeletal  (+) Arthritis , Osteoarthritis,  Acute meniscal tear L knee   Abdominal (+) + obese,   Peds negative pediatric ROS (+)  Hematology negative hematology ROS (+)   Anesthesia Other Findings   Reproductive/Obstetrics negative OB ROS                            Anesthesia Physical Anesthesia Plan  ASA: III  Anesthesia Plan: General and Regional   Post-op Pain Management: GA combined w/ Regional for post-op pain   Induction: Intravenous  PONV Risk Score and Plan: 2 and Ondansetron, Dexamethasone, Midazolam and Treatment may vary due to age or medical condition  Airway Management Planned: LMA  Additional Equipment: None  Intra-op Plan:   Post-operative Plan: Extubation in OR  Informed Consent: I have reviewed the patients History and Physical, chart, labs and discussed the procedure including the risks, benefits and alternatives for the proposed anesthesia with the patient or authorized representative who has indicated his/her understanding and acceptance.      Dental advisory given  Plan Discussed with: CRNA  Anesthesia Plan Comments:        Anesthesia Quick Evaluation

## 2020-08-16 NOTE — Anesthesia Postprocedure Evaluation (Signed)
Anesthesia Post Note  Patient: [name]  Procedure(s) Performed: LEFT KNEE ARTHROSCOPY WITH PARTIAL MEDIAL MENISECTOMY AND MEDIAL PATELLOFEMORAL CHONDROPLASTY (Left Knee)     Patient location during evaluation: PACU Anesthesia Type: Regional and General Level of consciousness: awake Pain management: pain level controlled Vital Signs Assessment: post-procedure vital signs reviewed and stable Respiratory status: spontaneous breathing Cardiovascular status: stable Postop Assessment: no apparent nausea or vomiting Anesthetic complications: no   No complications documented.  Last Vitals:  Vitals:   08/16/20 0945 08/16/20 1000  BP: (!) 141/79 (!) 143/87  Pulse: 93 78  Resp: 13 12  Temp:    SpO2: 97% 95%    Last Pain:  Vitals:   08/16/20 1000  TempSrc:   PainSc: 0-No pain   Pain Goal: Patients Stated Pain Goal: 4 (08/16/20 0717)                 Huston Foley

## 2020-08-16 NOTE — Progress Notes (Signed)
Assisted Dr. Doroteo Glassman with left, ultrasound guided, adductor canal block. Side rails up, monitors on throughout procedure. See vital signs in flow sheet. Tolerated Procedure well.

## 2020-08-16 NOTE — Anesthesia Procedure Notes (Signed)
Anesthesia Regional Block: Adductor canal block   Pre-Anesthetic Checklist: ,, timeout performed, Correct Patient, Correct Site, Correct Laterality, Correct Procedure, Correct Position, site marked, Risks and benefits discussed,  Surgical consent,  Pre-op evaluation,  At surgeon's request and post-op pain management  Laterality: Left  Prep: Maximum Sterile Barrier Precautions used, chloraprep       Needles:  Injection technique: Single-shot  Needle Type: Echogenic Stimulator Needle     Needle Length: 9cm  Needle Gauge: 22     Additional Needles:   Procedures:,,,, ultrasound used (permanent image in chart),,,,  Narrative:  Start time: 08/16/2020 8:00 AM End time: 08/16/2020 8:05 AM Injection made incrementally with aspirations every 5 mL.  Performed by: Personally  Anesthesiologist: Pervis Hocking, DO  Additional Notes: Monitors applied. No increased pain on injection. No increased resistance to injection. Injection made in 5cc increments. Good needle visualization. Patient tolerated procedure well.

## 2020-08-16 NOTE — Op Note (Signed)
NAME: [name], [name] MEDICAL RECORD [id number] ACCOUNT [id number] DATE OF BIRTH:01-Aug-1971 FACILITY: [facility] LOCATION: [facility] PHYSICIAN:Jasai Sorg D. Roy Snuffer, MD  OPERATIVE REPORT  DATE OF PROCEDURE:  08/16/2020  PREOPERATIVE DIAGNOSIS:  Left knee medial meniscal tear associated with chondromalacia within the medial and patellofemoral compartments.  POSTOPERATIVE DIAGNOSES AND FINDINGS: 1.  Radial tear of the posterior horn of the medial meniscus with an unstable fragment. 2.  Grade II to III chondromalacia within the medial compartment, despite MRI findings. 3.  Grade III diffuse changes of chondromalacia within the patellofemoral compartment, particularly on the trochlear surface.  PROCEDURE: 1.  Left knee diagnostic and operative arthroscopy with partial medial meniscectomy. 2.  Medial and patellofemoral chondroplasty with debridement only.  There was no evidence of eburnated bone and thus no microfracture abrasion indicated.  SURGEON:  Paralee Cancel, MD  ASSISTANT:  Surgical team.  ANESTHESIA:  Regional plus general LMA.  SPECIMENS:  None.  COMPLICATIONS:  None.  TOURNIQUET:  Not utilized.  INDICATIONS:  The patient is a 49 year old female seen and evaluated for left knee pain in the office.  Radiographs have revealed relatively preserved joint spaces.  Attempts at conservative measures including activity modification, strengthening,  medications and cortisone injections failed to provide substantial relief.  Subsequently, an MRI was ordered confirming suspicion of meniscal pathology, but also identified some moderate chondromalacia within the medial and patellofemoral compartments.   Based on her age, we discussed treatment options from a conservative standpoint.  Given the fact she had failed conservative treatment, we discussed arthroscopic surgery to manage the meniscus to try to eliminate the mechanical source of symptoms, but  also to try to eliminate mechanical issues  from unstable flaps of cartilage.  We reviewed the minimal risk for infection, DVT.  We discussed the potential for recurrent meniscal tear and progression of arthritis.  We discussed the postoperative course  with her.  Consent was obtained for the benefit of pain relief.  PROCEDURE IN DETAIL:  The patient was brought to the operative theater.  Once adequate anesthesia, preoperative antibiotics, Ancef administered, she was positioned supine.  The left leg was placed in a leg holder.  Timeout was performed identifying the  patient, the planned procedure and extremity.  A 2-portal technique was utilized.  Inferior lateral portal was created for arthroscopic camera.  Diagnostic evaluation of knee revealed the above findings.  The inferior medial portal was created for her as  a working portal.  Probe examination confirmed an unstable medial meniscal tear.  I used a combination of the straight biting basket and 3.5 Cuda shaver to perform a partial meniscectomy involving the posterior root predominantly, but then into the  posterior body.  Probably 15% of the meniscus was removed during this.  Within the medial compartment, there were some areas of unstable flaps of cartilage identified on the medial femoral condyle predominantly.  There was no evidence of eburnated bone  or exposed bone, which I was somewhat anticipating by her MRI.  Once I was satisfied with the stability of the meniscal and articular surfaces, we addressed anteriorly and identified an intact anterior cruciate ligament.  Her lateral compartment was  normal.  I then addressed the patellofemoral compartment.  Here we identified through a synovectomy including a plica shelf resection evidence of diffuse grade III changes mainly over the medial femoral trochlea and within the trochlear apex as well.   The patella itself had some unstable flaps of cartilage, which were debrided back, but again no eburnated bone within the  patellofemoral  compartment that required advanced microfracture abrasion techniques.  The knee was then reexamined to make certain  that there were no loose remaining fragments of cartilage and that the remaining articular meniscal surfaces were stable.  The instrumentation was then removed from the knee.  The portal sites were reapproximated using a 4-0 nylon.  The knee was injected  at the end of the case with Marcaine.  The knee was then wrapped in a sterile bulky wrap.  She was brought to the recovery room in stable condition, tolerating the procedure well.  Findings were reviewed with her friend who brought her to the hospital  today.  We will see her back in the office in 10-12 days for suture removal, review findings and set up physical therapy to address strengthening her lower extremity.  HN/NUANCE  D:08/16/2020 T:08/16/2020 JOB:[id number]

## 2020-08-16 NOTE — Interval H&P Note (Signed)
History and Physical Interval Note:  08/16/2020 7:13 AM  [name]  has presented today for surgery, with the diagnosis of Left knee medial meniscal tear, patellofemoral chondromalacia.  The various methods of treatment have been discussed with the patient and family. After consideration of risks, benefits and other options for treatment, the patient has consented to  Procedure(s) with comments: lEFT KNEE ARTHROSCOPY WITH PARTIAL MEDIAL MENISECTOMY AND MEDIAL PATELLOFEMORAL CHONDROPLASTY (Left) - 45 mins as a surgical intervention.  The patient's history has been reviewed, patient examined, no change in status, stable for surgery.  I have reviewed the patient's chart and labs.  Questions were answered to the patient's satisfaction.     Mauri Pole

## 2020-08-16 NOTE — Anesthesia Procedure Notes (Signed)
Procedure Name: LMA Insertion Date/Time: 08/16/2020 8:15 AM Performed by: Rogers Blocker, CRNA Pre-anesthesia Checklist: Patient identified, Emergency Drugs available, Suction available and Patient being monitored Patient Re-evaluated:Patient Re-evaluated prior to induction Oxygen Delivery Method: Circle System Utilized Preoxygenation: Pre-oxygenation with 100% oxygen Induction Type: IV induction Ventilation: Mask ventilation without difficulty LMA: LMA inserted LMA Size: 4.0 Number of attempts: 1 Placement Confirmation: positive ETCO2 Tube secured with: Tape Dental Injury: Teeth and Oropharynx as per pre-operative assessment

## 2020-08-16 NOTE — Discharge Instructions (Signed)
INSTRUCTIONS AFTER SURGERY  o Remove items at home which could result in a fall. This includes throw rugs or furniture in walking pathways o ICE to the affected joint every three hours while awake for 30 minutes at a time, for at least the first 3-5 days, and then as needed for pain and swelling.  Continue to use ice for pain and swelling. You may notice swelling that will progress down to the foot and ankle.  This is normal after surgery.  Elevate your leg when you are not up walking on it.   o Continue to use the breathing machine you got in the hospital (incentive spirometer) which will help keep your temperature down.  It is common for your temperature to cycle up and down following surgery, especially at night when you are not up moving around and exerting yourself.  The breathing machine keeps your lungs expanded and your temperature down.  DIET:  As you were doing prior to hospitalization, we recommend a well-balanced diet.  DRESSING / WOUND CARE / SHOWERING  You may change your dressing 3-5 days after surgery.  Then change the dressing every day with sterile gauze.  Please use good hand washing techniques before changing the dressing.  Do not use any lotions or creams on the incision until instructed by your surgeon.  ACTIVITY  o Increase activity slowly as tolerated, but follow the weight bearing instructions below.   o No driving for 6 weeks or until further direction given by your physician.  You cannot drive while taking narcotics.  o No lifting or carrying greater than 10 lbs. until further directed by your surgeon. o Avoid periods of inactivity such as sitting longer than an hour when not asleep. This helps prevent blood clots.  o You may return to work once you are authorized by your doctor.   WEIGHT BEARING   Weight bearing as tolerated with assist device (walker, cane, etc) as directed, use it as long as suggested by your surgeon or therapist, typically at least 4-6  weeks.  CONSTIPATION  Constipation is defined medically as fewer than three stools per week and severe constipation as less than one stool per week.  Even if you have a regular bowel pattern at home, your normal regimen is likely to be disrupted due to multiple reasons following surgery.  Combination of anesthesia, postoperative narcotics, change in appetite and fluid intake all can affect your bowels.   YOU MUST use at least one of the following options; they are listed in order of increasing strength to get the job done.  They are all available over the counter, and you may need to use some, POSSIBLY even all of these options:    Drink plenty of fluids (prune juice may be helpful) and high fiber foods Colace 100 mg by mouth twice a day  Senokot for constipation as directed and as needed Dulcolax (bisacodyl), take with full glass of water  Miralax (polyethylene glycol) once or twice a day as needed.  If you have tried all these things and are unable to have a bowel movement in the first 3-4 days after surgery call either your surgeon or your primary doctor.    If you experience loose stools or diarrhea, hold the medications until you stool forms back up.  If your symptoms do not get better within 1 week or if they get worse, check with your doctor.  If you experience "the worst abdominal pain ever" or develop nausea or vomiting, please  contact the office immediately for further recommendations for treatment.  ITCHING:  If you experience itching with your medications, try taking only a single pain pill, or even half a pain pill at a time.  You can also use Benadryl over the counter for itching or also to help with sleep.   TED HOSE STOCKINGS:  Use stockings on both legs until for at least 2 weeks or as directed by physician office. They may be removed at night for sleeping.  MEDICATIONS:  See your medication summary on the "After Visit Summary" that nursing will review with you.  You may have  some home medications which will be placed on hold until you complete the course of blood thinner medication.  It is important for you to complete the blood thinner medication as prescribed.  PRECAUTIONS:  If you experience chest pain or shortness of breath - call 911 immediately for transfer to the hospital emergency department.   If you develop a fever greater that 101 F, purulent drainage from wound, increased redness or drainage from wound, foul odor from the wound/dressing, or calf pain - CONTACT YOUR SURGEON.                                                   FOLLOW-UP APPOINTMENTS:  If you do not already have a post-op appointment, please call the office for an appointment to be seen by your surgeon.  Guidelines for how soon to be seen are listed in your "After Visit Summary", but are typically between 1-4 weeks after surgery.  MAKE SURE YOU:  . Understand these instructions.  . Get help right away if you are not doing well or get worse.   Thank you for letting us be a part of your medical care team.  It is a privilege we respect greatly.  We hope these instructions will help you stay on track for a fast and full recovery!   Regional Anesthesia Blocks  1. Numbness or the inability to move the "blocked" extremity may last from 3-48 hours after placement. The length of time depends on the medication injected and your individual response to the medication. If the numbness is not going away after 48 hours, call your surgeon.  2. The extremity that is blocked will need to be protected until the numbness is gone and the  Strength has returned. Because you cannot feel it, you will need to take extra care to avoid injury. Because it may be weak, you may have difficulty moving it or using it. You may not know what position it is in without looking at it while the block is in effect.  3. For blocks in the legs and feet, returning to weight bearing and walking needs to be done carefully. You will need  to wait until the numbness is entirely gone and the strength has returned. You should be able to move your leg and foot normally before you try and bear weight or walk. You will need someone to be with you when you first try to ensure you do not fall and possibly risk injury.  4. Bruising and tenderness at the needle site are common side effects and will resolve in a few days.  5. Persistent numbness or new problems with movement should be communicated to the surgeon or the [place] [phone] [place]  [place] [phone]).  Post Anesthesia Home Care Instructions  Activity: Get plenty of rest for the remainder of the day. A responsible individual must stay with you for 24 hours following the procedure.  For the next 24 hours, DO NOT: -Drive a car -Paediatric nurse -Drink alcoholic beverages -Take any medication unless instructed by your physician -Make any legal decisions or sign important papers.  Meals: Start with liquid foods such as gelatin or soup. Progress to regular foods as tolerated. Avoid greasy, spicy, heavy foods. If nausea and/or vomiting occur, drink only clear liquids until the nausea and/or vomiting subsides. Call your physician if vomiting continues.  Special Instructions/Symptoms: Your throat may feel dry or sore from the anesthesia or the breathing tube placed in your throat during surgery. If this causes discomfort, gargle with warm salt water. The discomfort should disappear within 24 hours.

## 2020-08-16 NOTE — Transfer of Care (Signed)
Immediate Anesthesia Transfer of Care Note  Patient: [name]  Procedure(s) Performed: LEFT KNEE ARTHROSCOPY WITH PARTIAL MEDIAL MENISECTOMY AND MEDIAL PATELLOFEMORAL CHONDROPLASTY (Left Knee)  Patient Location: PACU  Anesthesia Type:General and Regional  Level of Consciousness: awake, alert , oriented and patient cooperative  Airway & Oxygen Therapy: Pt spont breathing and connected to [place] O2  Post-op Assessment: Report given to RN and Post -op Vital signs reviewed and stable  Post vital signs: Reviewed and stable  Last Vitals:  Vitals Value Taken Time  BP 156/76 08/16/20 0909  Temp 36.7 C 08/16/20 0909  Pulse 74 08/16/20 0910  Resp 17 08/16/20 0910  SpO2 98 % 08/16/20 0910  Vitals shown include unvalidated device data.  Last Pain:  Vitals:   08/16/20 0755  TempSrc:   PainSc: 0-No pain      Patients Stated Pain Goal: 4 (53/96/72 8979)  Complications: No complications documented.

## 2020-08-17 ENCOUNTER — Encounter (HOSPITAL_BASED_OUTPATIENT_CLINIC_OR_DEPARTMENT_OTHER): Payer: Self-pay | Admitting: Orthopedic Surgery

## 2020-08-17 NOTE — Progress Notes (Signed)
Pt states her left leg is cold, has full sensation and is able to wiggled toes, but cap refill is 4-5 seconds. Pt states she has elevated her leg above her heart all night. Instructed pt to wiggle toes, continue ice and elevation and call surgeon to see if they want to see her in the office today for bandage readjustment.

## 2020-08-23 ENCOUNTER — Ambulatory Visit: Payer: Managed Care, Other (non HMO)

## 2020-08-31 ENCOUNTER — Ambulatory Visit: Payer: Managed Care, Other (non HMO) | Admitting: Nurse Practitioner

## 2020-09-11 ENCOUNTER — Other Ambulatory Visit: Payer: Self-pay | Admitting: Gastroenterology

## 2020-09-11 NOTE — Telephone Encounter (Signed)
May have refill until time of office visit. Needs office visit for further refills. Thanks.

## 2020-09-20 ENCOUNTER — Other Ambulatory Visit: Payer: Self-pay | Admitting: *Deleted

## 2020-09-20 ENCOUNTER — Other Ambulatory Visit: Payer: Self-pay

## 2020-09-20 ENCOUNTER — Ambulatory Visit: Payer: Managed Care, Other (non HMO) | Admitting: Sports Medicine

## 2020-09-20 ENCOUNTER — Encounter: Payer: Self-pay | Admitting: Sports Medicine

## 2020-09-20 DIAGNOSIS — L570 Actinic keratosis: Secondary | ICD-10-CM | POA: Diagnosis not present

## 2020-09-20 DIAGNOSIS — M79671 Pain in right foot: Secondary | ICD-10-CM

## 2020-09-20 DIAGNOSIS — B351 Tinea unguium: Secondary | ICD-10-CM | POA: Diagnosis not present

## 2020-09-20 DIAGNOSIS — L608 Other nail disorders: Secondary | ICD-10-CM

## 2020-09-20 DIAGNOSIS — M79672 Pain in left foot: Secondary | ICD-10-CM

## 2020-09-20 DIAGNOSIS — M79609 Pain in unspecified limb: Secondary | ICD-10-CM

## 2020-09-20 NOTE — Progress Notes (Signed)
Subjective: [name] is a 49 y.o. female patient who presents to office for evaluation of Right> Left foot pain secondary to callus skin and pain at ingrowing nails. Patient complains of pain at the lesion present Right sub met 4 and pain to nails difficult to walk because they are sore. Patient denies any other pedal complaints.   Patient Active Problem List   Diagnosis Date Noted  . Acute medial meniscal tear, left, subsequent encounter 08/16/2020  . S/P arthroscopy of left knee 08/16/2020  . Pain in left knee 03/15/2020  . RUQ pain 04/30/2019  . Rectal bleeding 04/30/2019  . Chronic anticoagulation 04/30/2019  . Non-intractable vomiting 04/30/2019  . Somatic dysfunction of right sacroiliac joint 07/08/2018  . Pain in limb 09/17/2012  . Phlebitis and thrombophlebitis of lower extremities, unspecified 09/17/2012    Current Outpatient Medications on File Prior to Visit  Medication Sig Dispense Refill  . Sertraline HCl (ZOLOFT PO) Take by mouth.    Marland Kitchen albuterol (PROVENTIL HFA;VENTOLIN HFA) 108 (90 BASE) MCG/ACT inhaler Inhale 2 puffs into the lungs every 6 (six) hours as needed for wheezing.    . Ascorbic Acid (VITAMIN C PO) Take by mouth daily.    . Cholecalciferol (VITAMIN D3 PO) Take by mouth daily.    . Cyanocobalamin (B-12 PO) Take by mouth daily.    . Multiple Vitamins-Minerals (ZINC PO) Take by mouth daily.    Marland Kitchen omeprazole (PRILOSEC) 40 MG capsule Take 40 mg by mouth daily.    Marland Kitchen oxyCODONE (ROXICODONE) 5 MG immediate release tablet Take 2 tablets (10 mg total) by mouth every 6 (six) hours as needed for moderate pain or severe pain. 30 tablet 0  . pantoprazole (PROTONIX) 40 MG tablet TAKE ONE TABLET BY MOUTH TWICE A DAY *MUST KEEP APPOINTMENT FOR FURTHER REFILLS* 60 tablet 0  . pravastatin (PRAVACHOL) 40 MG tablet Take 40 mg by mouth at bedtime.     . rivaroxaban (XARELTO) 20 MG TABS tablet Take 20 mg by mouth daily with supper.    . varenicline (CHANTIX) 1 MG tablet 1 tablet 2  (two) times daily.    Marland Kitchen zolpidem (AMBIEN) 10 MG tablet Take 10 mg by mouth at bedtime as needed. Sleep     No current facility-administered medications on file prior to visit.    Allergies  Allergen Reactions  . Latex Itching    Objective:  General: Alert and oriented x3 in no acute distress  Dermatology: Keratotic lesion present sub met 4> bilateral hallux with skin lines transversing the lesions, pain is present with direct pressure to the lesion with a central nucleated core noted sub met 4 on right, no webspace macerations, no ecchymosis bilateral, all nails x 10 are well manicured except bilateral hallux where there is thickness to nail with incurvation to hallux nails/pincer deformity with no infection.  Vascular: Dorsalis Pedis and Posterior Tibial pedal pulses 2/4, Capillary Fill Time 3 seconds, + pedal hair growth bilateral, no edema bilateral lower extremities, Temperature gradient within normal limits.  Neurology: Johney Maine sensation intact via light touch bilateral.  Musculoskeletal: Mild tenderness with palpation at the keratotic lesion site on Right>Left, Muscular strength 5/5 in all groups without pain or limitation on range of motion. No lower extremity muscular or boney deformity noted.  Assessment and Plan: Problem List Items Addressed This Visit    None    Visit Diagnoses    Keratosis    -  Primary   Pain due to onychomycosis of nail  Pincer nail deformity       Foot pain, bilateral          -Complete examination performed -Discussed treatment options -Parred keratoic lesion using a chisel blade x3 ; treated the area with Salinocaine covered with bandaid right submet 4 -At no additional charge debrided hallux nail using a sterile nail nipper and removed ingrowing corners -Encouraged daily skin emollients -Encouraged use of pumice stone -Advised good supportive shoes and inserts and offloading padding if helps may benefit from orthotics -Patient to return  to office as needed or sooner if condition worsens. Advised patient if nails continue to bother her may benefit from a nail procedure and orthotics to help with pain at keratosis. Landis Martins, DPM

## 2020-09-20 NOTE — Patient Instructions (Addendum)
B1252 orthotic code M77.9 capsulitis L57.0 keratosis

## 2020-09-30 ENCOUNTER — Ambulatory Visit: Payer: Managed Care, Other (non HMO)

## 2020-10-04 ENCOUNTER — Ambulatory Visit: Payer: Managed Care, Other (non HMO)

## 2020-10-10 ENCOUNTER — Other Ambulatory Visit: Payer: Self-pay | Admitting: Gastroenterology

## 2020-11-16 ENCOUNTER — Ambulatory Visit: Payer: Managed Care, Other (non HMO) | Admitting: Gastroenterology

## 2020-11-17 ENCOUNTER — Other Ambulatory Visit: Payer: Self-pay | Admitting: Gastroenterology

## 2020-11-21 NOTE — Telephone Encounter (Signed)
Needs office visit prior to additional refills. Last seen 05/2019. Thank you.  

## 2020-11-21 NOTE — Telephone Encounter (Signed)
Following message received from Dr. Tarri Glenn - Needs office visit prior to additional refills. Last seen 05/2019. Thank you.

## 2020-12-02 ENCOUNTER — Ambulatory Visit: Payer: Managed Care, Other (non HMO) | Admitting: Gastroenterology

## 2020-12-20 ENCOUNTER — Other Ambulatory Visit: Payer: Self-pay | Admitting: Gastroenterology

## 2021-01-19 ENCOUNTER — Other Ambulatory Visit: Payer: Self-pay | Admitting: Gastroenterology

## 2021-06-14 ENCOUNTER — Ambulatory Visit: Payer: Managed Care, Other (non HMO) | Admitting: Nurse Practitioner

## 2021-06-27 ENCOUNTER — Ambulatory Visit: Payer: Managed Care, Other (non HMO) | Admitting: Gastroenterology

## 2021-09-05 ENCOUNTER — Other Ambulatory Visit: Payer: Self-pay | Admitting: Physician Assistant

## 2021-09-05 DIAGNOSIS — Z1231 Encounter for screening mammogram for malignant neoplasm of breast: Secondary | ICD-10-CM

## 2021-09-21 ENCOUNTER — Ambulatory Visit: Payer: Managed Care, Other (non HMO)

## 2021-10-20 ENCOUNTER — Ambulatory Visit: Payer: Managed Care, Other (non HMO)

## 2021-11-06 ENCOUNTER — Ambulatory Visit: Payer: Managed Care, Other (non HMO)

## 2022-06-22 ENCOUNTER — Other Ambulatory Visit: Payer: Self-pay | Admitting: Physician Assistant

## 2022-06-22 ENCOUNTER — Encounter: Payer: Self-pay | Admitting: Gastroenterology

## 2022-06-22 DIAGNOSIS — Z1231 Encounter for screening mammogram for malignant neoplasm of breast: Secondary | ICD-10-CM

## 2022-07-06 ENCOUNTER — Ambulatory Visit: Payer: Managed Care, Other (non HMO)

## 2022-07-31 ENCOUNTER — Ambulatory Visit: Payer: Managed Care, Other (non HMO)

## 2022-08-28 ENCOUNTER — Ambulatory Visit
Admission: RE | Admit: 2022-08-28 | Discharge: 2022-08-28 | Disposition: A | Discharge status: Home / Self Care | Payer: Managed Care, Other (non HMO) | Source: Ambulatory Visit | Attending: Physician Assistant | Admitting: Physician Assistant

## 2022-08-28 DIAGNOSIS — Z1231 Encounter for screening mammogram for malignant neoplasm of breast: Secondary | ICD-10-CM

## 2022-10-11 DIAGNOSIS — Z90710 Acquired absence of both cervix and uterus: Secondary | ICD-10-CM | POA: Insufficient documentation

## 2023-02-16 ENCOUNTER — Emergency Department (HOSPITAL_COMMUNITY): Payer: Managed Care, Other (non HMO)

## 2023-02-16 ENCOUNTER — Emergency Department (HOSPITAL_COMMUNITY)
Admission: EM | Admit: 2023-02-16 | Discharge: 2023-02-16 | Disposition: A | Discharge status: Home / Self Care | Payer: Managed Care, Other (non HMO) | Attending: Emergency Medicine | Admitting: Emergency Medicine

## 2023-02-16 ENCOUNTER — Other Ambulatory Visit: Payer: Self-pay

## 2023-02-16 ENCOUNTER — Encounter (HOSPITAL_COMMUNITY): Payer: Self-pay | Admitting: *Deleted

## 2023-02-16 DIAGNOSIS — M542 Cervicalgia: Secondary | ICD-10-CM | POA: Insufficient documentation

## 2023-02-16 DIAGNOSIS — I714 Abdominal aortic aneurysm, without rupture, unspecified: Secondary | ICD-10-CM | POA: Insufficient documentation

## 2023-02-16 DIAGNOSIS — R0789 Other chest pain: Secondary | ICD-10-CM | POA: Insufficient documentation

## 2023-02-16 DIAGNOSIS — Z91040 Latex allergy status: Secondary | ICD-10-CM | POA: Diagnosis not present

## 2023-02-16 DIAGNOSIS — Z7901 Long term (current) use of anticoagulants: Secondary | ICD-10-CM | POA: Insufficient documentation

## 2023-02-16 DIAGNOSIS — Z86718 Personal history of other venous thrombosis and embolism: Secondary | ICD-10-CM | POA: Diagnosis not present

## 2023-02-16 DIAGNOSIS — M546 Pain in thoracic spine: Secondary | ICD-10-CM | POA: Diagnosis not present

## 2023-02-16 DIAGNOSIS — R079 Chest pain, unspecified: Secondary | ICD-10-CM | POA: Diagnosis present

## 2023-02-16 LAB — BASIC METABOLIC PANEL
Anion gap: 10 (ref 5–15)
BUN: 18 mg/dL (ref 6–20)
CO2: 23 mmol/L (ref 22–32)
Calcium: 9.2 mg/dL (ref 8.9–10.3)
Chloride: 107 mmol/L (ref 98–111)
Creatinine, Ser: 0.95 mg/dL (ref 0.44–1.00)
GFR, Estimated: >60 mL/min (ref >60)
Glucose, Bld: 117 mg/dL — ABNORMAL HIGH (ref 70–99)
Potassium: 3.7 mmol/L (ref 3.5–5.1)
Sodium: 140 mmol/L (ref 135–145)

## 2023-02-16 LAB — CBC
HCT: 41.2 % (ref 36.0–46.0)
Hemoglobin: 13.3 g/dL (ref 12.0–15.0)
MCH: 28.6 pg (ref 26.0–34.0)
MCHC: 32.3 g/dL (ref 30.0–36.0)
MCV: 88.6 fL (ref 80.0–100.0)
Platelets: 275 10*3/uL (ref 150–400)
RBC: 4.65 MIL/uL (ref 3.87–5.11)
RDW: 14 % (ref 11.5–15.5)
WBC: 8 10*3/uL (ref 4.0–10.5)
nRBC: 0 % (ref 0.0–0.2)

## 2023-02-16 LAB — TROPONIN I (HIGH SENSITIVITY)
Troponin I (High Sensitivity): 2 ng/L (ref <18)
Troponin I (High Sensitivity): 3 ng/L (ref <18)

## 2023-02-16 MED ORDER — IOHEXOL 350 MG/ML SOLN
100.0000 mL | Freq: Once | INTRAVENOUS | Status: AC | PRN
  Administered 2023-02-16: 100 mL via INTRAVENOUS

## 2023-02-16 MED ORDER — KETOROLAC TROMETHAMINE 15 MG/ML IJ SOLN
15.0000 mg | Freq: Once | INTRAMUSCULAR | Status: AC
  Administered 2023-02-16: 15 mg via INTRAVENOUS
  Filled 2023-02-16: qty 1

## 2023-02-16 MED ORDER — ACETAMINOPHEN 500 MG PO TABS
1000.0000 mg | ORAL_TABLET | Freq: Once | ORAL | Status: AC
  Administered 2023-02-16: 1000 mg via ORAL
  Filled 2023-02-16: qty 2

## 2023-02-16 NOTE — ED Provider Notes (Signed)
[place] EMERGENCY DEPARTMENT AT [facility] Provider Note   CSN: [id number] Arrival date & time: 02/16/23  1554     History  Chief Complaint  Patient presents with   Back Pain    [name] is a 52 y.o. 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   Pt c/o neck and upper back pain since yesterday, now associated with central chest pain as well. Back pain is located on both sides of her thoracic spine, going up into her neck on the back. No h/o similar. No trauma/falls, f/c, cough, SOB, nausea/vomiting, diaphoresis. When asked to describe it she states it just hurts. She is on blood thinners for former DVT. She hasn't had any new leg swelling/redness/pain. No h/o MI.    Back Pain      Home Medications Prior to Admission medications   Medication Sig Start Date End Date Taking? Authorizing Provider  albuterol (PROVENTIL HFA;VENTOLIN HFA) 108 (90 BASE) MCG/ACT inhaler Inhale 2 puffs into the lungs every 6 (six) hours as needed for wheezing.    [provider]  Ascorbic Acid (VITAMIN C PO) Take by mouth daily.    [provider]  Cholecalciferol (VITAMIN D3 PO) Take by mouth daily.    [provider]  Cyanocobalamin (B-12 PO) Take by mouth daily.    [provider]  lidocaine (LIDODERM) 5 % 1 patch daily. 09/02/20   [provider]  Multiple Vitamins-Minerals (ZINC PO) Take by mouth daily.    [provider]  omeprazole (PRILOSEC) 40 MG capsule Take 40 mg by mouth daily.    [provider]  oxyCODONE-acetaminophen (PERCOCET) 10-325 MG tablet Take 1 tablet by mouth 2 (two) times daily as needed. 08/31/20   [provider]  pantoprazole (PROTONIX) 40 MG tablet TAKE ONE TABLET BY MOUTH TWICE A DAY *MUST KEEP APPOINTMENT FOR FURTHER REFILLS* 12/20/20   Tressia Danas, MD  pravastatin (PRAVACHOL) 40 MG tablet Take 40 mg by mouth at bedtime.     [provider]  rivaroxaban  (XARELTO) 20 MG TABS tablet Take 20 mg by mouth daily with supper.    [provider]  Sertraline HCl (ZOLOFT PO) Take by mouth.    [provider]  varenicline (CHANTIX) 1 MG tablet 1 tablet 2 (two) times daily.    [provider]  zolpidem (AMBIEN) 10 MG tablet Take 10 mg by mouth at bedtime as needed. Sleep    [provider]      Allergies    Latex    Review of Systems   Review of Systems  Musculoskeletal:  Positive for back pain.   Review of systems Negative for f/c.  A 10 point review of systems was performed and is negative unless otherwise reported in HPI.  Physical Exam Updated Vital Signs BP (!) 142/87 (BP Location: Right Arm)   Pulse (!) 55   Temp 97.9 F (36.6 C) (Oral)   Resp 18   Ht 5\' 4"  (1.626 m)   Wt 89.2 kg   SpO2 98%   BMI 33.76 kg/m  Physical Exam General: Normal appearing female, lying in bed.  HEENT: PERRLA, Sclera anicteric, MMM, trachea midline.  Cardiology: RRR, no murmurs/rubs/gallops. BL radial and DP pulses equal bilaterally. No chest wall TTP, no crepitus.  Resp: Normal respiratory rate and effort. CTAB, no wheezes, rhonchi, crackles.  Abd: Soft, non-tender, non-distended. No rebound tenderness or guarding.  GU: Deferred. MSK: No peripheral edema or signs of  trauma. Extremities without deformity or TTP. No cyanosis or clubbing. Skin: warm, dry. No rashes or lesions. Back: No CVA tenderness. TTP to paraspinal musculature of thoracic spine and cervical spine. No midline C T or L spine TTP, no deformities/stepoffs.  Neuro: A&Ox4, CNs II-XII grossly intact. MAEs. Sensation grossly intact.  Psych: Normal mood and affect.   ED Results / Procedures / Treatments   Labs (all labs ordered are listed, but only abnormal results are displayed) Labs Reviewed  BASIC METABOLIC PANEL - Abnormal; Notable for the following components:      Result Value   Glucose, Bld 117 (*)    All other components within normal limits  CBC   TROPONIN I (HIGH SENSITIVITY)  TROPONIN I (HIGH SENSITIVITY)    EKG EKG Interpretation  Date/Time:  Saturday Feb 16 2023 16:03:04 EDT Ventricular Rate:  80 PR Interval:  146 QRS Duration: 82 QT Interval:  360 QTC Calculation: 415 R Axis:   46 Text Interpretation: Normal sinus rhythm Confirmed by Vivi Barrack [phone]) on 02/16/2023 6:42:35 PM  Radiology No results found.  Procedures Procedures    Medications Ordered in ED Medications  iohexol (OMNIPAQUE) 350 MG/ML injection 100 mL (100 mLs Intravenous Contrast Given 02/16/23 1950)  acetaminophen (TYLENOL) tablet 1,000 mg (1,000 mg Oral Given 02/16/23 2037)  ketorolac (TORADOL) 15 MG/ML injection 15 mg (15 mg Intravenous Given 02/16/23 2037)    ED Course/ Medical Decision Making/ A&P                          Medical Decision Making Amount and/or Complexity of Data Reviewed Labs:  Decision-making details documented in ED Course. Radiology: ordered. Decision-making details documented in ED Course.  Risk OTC drugs. Prescription drug management.    This patient presents to the ED for concern of chest pain radiating to neck/back, this involves an extensive number of treatment options, and is a complaint that carries with it a high risk of complications and morbidity.  I considered the following differential and admission for this acute, potentially life threatening condition. Overall she is well-appearing and HDS.   MDM:    DDX for chest pain includes but is not limited to:  ACS/arrhythmia,  PE, aortic dissection, PNA, PTX, esophogeal rupture, biliary disease, pericarditis, GERD/PUD/gastritis, or musculoskeletal pain. Very low suspicion for ACS vs arrhythmia, and EKG doesn't demonstrate any signs of ischemia. Patient with chest pain radiating to neck and back, there is concern for AAS, will obtain CT dissection protocol. Patient cannot PERC out based on age, but does take xarelto for h/o DVT. Has no h/o PE, still possible though  no tachypnea/SOB/hypoxia. No abdominal pain and no c/f biliary disease. Also consider musculoskeletal pain as patient does have TTP to paraspinal thoracic musculature. No trauma, no midline vertebral TTP, no c/f vertebral fracture. No f/c, h/o malignancy or IVDU, no back pain red flags.    Clinical Course as of 02/23/23 1533  Sat Feb 16, 2023  1718 Troponin I (High Sensitivity): 2 [HN]  1718 Basic metabolic panel(!) unremarkable [HN]  1718 CBC wnl [HN]  1719 DG Chest 2 View FINDINGS: Normal heart, mediastinum and hila.  Clear lungs.  No pleural effusion or pneumothorax.  Skeletal structures are intact.  IMPRESSION: No active cardiopulmonary disease.   [HN]  1958 CT Angio Chest/Abd/Pel for Dissection W and/or Wo Contrast No evidence of aortic aneurysm or dissection. Atherosclerosis in the infrarenal aorta.  No evidence of pulmonary embolus.  No acute findings in  the chest, abdomen or pelvis.   [HN]    Clinical Course User Index [HN] Loetta Rough, MD    Labs: I Ordered, and personally interpreted labs.  The pertinent results include:  those listed above  Imaging Studies ordered: I ordered imaging studies including CXR, CT dissection I independently visualized and interpreted imaging. I agree with the radiologist interpretation  Additional history obtained from chart review.    Cardiac Monitoring: The patient was maintained on a cardiac monitor.  I personally viewed and interpreted the cardiac monitored which showed an underlying rhythm of: NSR  Reevaluation: After tylenol/toradol, I reevaluated the patient and found that they have :improved  Social Determinants of Health: Patient lives independently   Disposition:  Pt with reassuring w/u for CP/back pain. Her pain is likely musculoskeletal. Advised to f/u with PCP in 1-2 weeks and take tylenol/toradol for pain. DC w/ discharge instructions/return precautions. All questions answered to patient's satisfaction.     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,  takes xarelto   per pt had negative work-up for hypercoagulability   (2012 LLE and recurrent 2014, RLE)   History of gastritis 05/2019   peptic duodenitis   History of kidney stones    Hyperlipidemia    Nephrolithiasis    OA (osteoarthritis)    OSA on CPAP    08-10-2020 per pt uses 5 times weekly   Wears glasses    Wears partial dentures      Medicines Meds ordered this encounter  Medications   iohexol (OMNIPAQUE) 350 MG/ML injection 100 mL   acetaminophen (TYLENOL) tablet 1,000 mg   ketorolac (TORADOL) 15 MG/ML injection 15 mg    I have reviewed the patients home medicines and have made adjustments as needed  Problem List / ED Course: Problem List Items Addressed This Visit   None Visit Diagnoses     Atypical chest pain    -  Primary   Acute bilateral thoracic back pain       Relevant Medications   acetaminophen (TYLENOL) tablet 1,000 mg (Completed)   ketorolac (TORADOL) 15 MG/ML injection 15 mg (Completed)                   This note was created using dictation software, which may contain spelling or grammatical errors.    Loetta Rough, MD 02/23/23 1539

## 2023-02-16 NOTE — ED Triage Notes (Signed)
Pt c/o neck and upper back pain since yesterday  she is c/o chest   she is on blood thinners for former dvt  for a year

## 2023-02-16 NOTE — Discharge Instructions (Signed)
Thank you for coming to [facility] Emergency Department. You were seen for chest and back pain. We did an exam, labs, and imaging, and these showed no acute findings.  You can alternate taking Tylenol and ibuprofen as needed for pain. You can take 650mg  tylenol (acetaminophen) every 4-6 hours, and 600 mg ibuprofen 3 times a day.  Please follow up with your primary care provider within 1 week.   Do not hesitate to return to the ED or call 911 if you experience: -Worsening symptoms -Chest pain, back pain, shortness of breath -Lightheadedness, passing out -Fevers/chills -Anything else that concerns you

## 2023-02-16 NOTE — ED Notes (Signed)
The pt was just taken to xray  her blood has not been drawn yet

## 2023-02-16 NOTE — ED Notes (Signed)
Patient transported to X-ray 

## 2023-07-16 HISTORY — PX: KNEE ARTHROSCOPY: SUR90

## 2023-07-31 ENCOUNTER — Other Ambulatory Visit: Payer: Self-pay | Admitting: Physician Assistant

## 2023-07-31 DIAGNOSIS — Z1231 Encounter for screening mammogram for malignant neoplasm of breast: Secondary | ICD-10-CM

## 2023-08-30 DIAGNOSIS — Z1231 Encounter for screening mammogram for malignant neoplasm of breast: Secondary | ICD-10-CM

## 2023-09-27 ENCOUNTER — Ambulatory Visit
Admission: RE | Admit: 2023-09-27 | Discharge: 2023-09-27 | Disposition: A | Discharge status: Home / Self Care | Payer: Managed Care, Other (non HMO) | Source: Ambulatory Visit | Attending: Physician Assistant | Admitting: Physician Assistant

## 2023-09-27 DIAGNOSIS — Z1231 Encounter for screening mammogram for malignant neoplasm of breast: Secondary | ICD-10-CM

## 2024-01-23 ENCOUNTER — Other Ambulatory Visit: Payer: Self-pay | Admitting: Physician Assistant

## 2024-01-23 DIAGNOSIS — Z87891 Personal history of nicotine dependence: Secondary | ICD-10-CM

## 2024-01-28 NOTE — Progress Notes (Signed)
 Surgery orders requested via Epic inbox.

## 2024-01-29 NOTE — Patient Instructions (Addendum)
 SURGICAL WAITING ROOM VISITATION Patients having surgery or a procedure may have no more than 2 support people in the waiting area - these visitors may rotate.    Children under the age of 33 must have an adult with them who is not the patient.  If the patient needs to stay at the hospital during part of their recovery, the visitor guidelines for inpatient rooms apply. Pre-op nurse will coordinate an appropriate time for 1 support person to accompany patient in pre-op.  This support person may not rotate.    Please refer to the [facility] website for the visitor guidelines for Inpatients (after your surgery is over and you are in a regular room).       Your procedure is scheduled on:  Thursday, 02-13-24   Report to [facility] Main Entrance    Report to admitting at 7:30 AM   Call this number if you have problems the morning of surgery [phone]   Do not eat food :After Midnight.   After Midnight you may have the following liquids until 7:00 AM DAY OF SURGERY  Water Non-Citrus Juices (without pulp, NO RED-Apple, White grape, White cranberry) Black Coffee (NO MILK/CREAM OR CREAMERS, sugar ok)  Clear Tea (NO MILK/CREAM OR CREAMERS, sugar ok) regular and decaf                             Plain Jell-O (NO RED)                                           Fruit ices (not with fruit pulp, NO RED)                                     Popsicles (NO RED)                                                               Sports drinks like Gatorade (NO RED)                   The day of surgery:  Drink ONE (1) Pre-Surgery Clear Ensure by ???? 7:00 AM the morning of surgery. Drink in one sitting. Do not sip.  This drink was given to you during your hospital  pre-op appointment visit. Nothing else to drink after completing the Pre-Surgery Clear Ensure           If you have questions, please contact your surgeon's office.   FOLLOW  ANY ADDITIONAL PRE OP INSTRUCTIONS YOU RECEIVED FROM  YOUR SURGEON'S OFFICE!!!     Oral Hygiene is also important to reduce your risk of infection.                                    Remember - BRUSH YOUR TEETH THE MORNING OF SURGERY WITH YOUR REGULAR TOOTHPASTE   Do NOT smoke after Midnight  ELIQUIS-  Stop taking this medication ?????   Take these medicines the morning of surgery  with A SIP OF WATER:    If needed Clonazepam, Oxycodone   Stop all vitamins and herbal supplements 7 days before surgery  Bring CPAP mask and tubing day of surgery.                              You may not have any metal on your body including hair pins, jewelry, and body piercing             Do not wear make-up, lotions, powders, perfumes or deodorant  Do not wear nail polish including gel and S&S, artificial/acrylic nails, or any other type of covering on natural nails including finger and toenails. If you have artificial nails, gel coating, etc. that needs to be removed by a nail salon please have this removed prior to surgery or surgery may need to be canceled/ delayed if the surgeon/ anesthesia feels like they are unable to be safely monitored.   Do not shave  48 hours prior to surgery.               Do not bring valuables to the hospital. [facility] IS NOT RESPONSIBLE   FOR VALUABLES.   Contacts, dentures or bridgework may not be worn into surgery.  DO NOT BRING YOUR HOME MEDICATIONS TO THE HOSPITAL. PHARMACY WILL DISPENSE MEDICATIONS LISTED ON YOUR MEDICATION LIST TO YOU DURING YOUR ADMISSION IN THE HOSPITAL!    Patients discharged on the day of surgery will not be allowed to drive home.  Someone NEEDS to stay with you for the first 24 hours after anesthesia.   Special Instructions: Bring a copy of your healthcare power of attorney and living will documents the day of surgery if you haven't scanned them before.              Please read over the following fact sheets you were given: IF YOU HAVE QUESTIONS ABOUT YOUR PRE-OP INSTRUCTIONS PLEASE CALL  [phone] [name]  If you received a COVID test during your pre-op visit  it is requested that you wear a mask when out in public, stay away from anyone that may not be feeling well and notify your surgeon if you develop symptoms. If you test positive for Covid or have been in contact with anyone that has tested positive in the last 10 days please notify you surgeon.    Pre-operative 5 CHG Bath Instructions   You can play a key role in reducing the risk of infection after surgery. Your skin needs to be as free of germs as possible. You can reduce the number of germs on your skin by washing with CHG (chlorhexidine gluconate) soap before surgery. CHG is an antiseptic soap that kills germs and continues to kill germs even after washing.   DO NOT use if you have an allergy to chlorhexidine/CHG or antibacterial soaps. If your skin becomes reddened or irritated, stop using the CHG and notify one of our RNs at [phone].   Please shower with the CHG soap starting 4 days before surgery using the following schedule:     Please keep in mind the following:  DO NOT shave, including legs and underarms, starting the day of your first shower.   You may shave your face at any point before/day of surgery.  Place clean sheets on your bed the day you start using CHG soap. Use a clean washcloth (not used since being washed) for each shower. DO NOT sleep with pets  once you start using the CHG.   CHG Shower Instructions:  If you choose to wash your hair and private area, wash first with your normal shampoo/soap.  After you use shampoo/soap, rinse your hair and body thoroughly to remove shampoo/soap residue.  Turn the water OFF and apply about 3 tablespoons (45 ml) of CHG soap to a CLEAN washcloth.  Apply CHG soap ONLY FROM YOUR NECK DOWN TO YOUR TOES (washing for 3-5 minutes)  DO NOT use CHG soap on face, private areas, open wounds, or sores.  Pay special attention to the area where your surgery is being  performed.  If you are having back surgery, having someone wash your back for you may be helpful. Wait 2 minutes after CHG soap is applied, then you may rinse off the CHG soap.  Pat dry with a clean towel  Put on clean clothes/pajamas   If you choose to wear lotion, please use ONLY the CHG-compatible lotions on the back of this paper.     Additional instructions for the day of surgery: DO NOT APPLY any lotions, deodorants, cologne, or perfumes.   Put on clean/comfortable clothes.  Brush your teeth.  Ask your nurse before applying any prescription medications to the skin.      CHG Compatible Lotions   Aveeno Moisturizing lotion  Cetaphil Moisturizing Cream  Cetaphil Moisturizing Lotion  Clairol Herbal Essence Moisturizing Lotion, Dry Skin  Clairol Herbal Essence Moisturizing Lotion, Extra Dry Skin  Clairol Herbal Essence Moisturizing Lotion, Normal Skin  Curel Age Defying Therapeutic Moisturizing Lotion with Alpha Hydroxy  Curel Extreme Care Body Lotion  Curel Soothing Hands Moisturizing Hand Lotion  Curel Therapeutic Moisturizing Cream, Fragrance-Free  Curel Therapeutic Moisturizing Lotion, Fragrance-Free  Curel Therapeutic Moisturizing Lotion, Original Formula  Eucerin Daily Replenishing Lotion  Eucerin Dry Skin Therapy Plus Alpha Hydroxy Crme  Eucerin Dry Skin Therapy Plus Alpha Hydroxy Lotion  Eucerin Original Crme  Eucerin Original Lotion  Eucerin Plus Crme Eucerin Plus Lotion  Eucerin TriLipid Replenishing Lotion  Keri Anti-Bacterial Hand Lotion  Keri Deep Conditioning Original Lotion Dry Skin Formula Softly Scented  Keri Deep Conditioning Original Lotion, Fragrance Free Sensitive Skin Formula  Keri Lotion Fast Absorbing Fragrance Free Sensitive Skin Formula  Keri Lotion Fast Absorbing Softly Scented Dry Skin Formula  Keri Original Lotion  Keri Skin Renewal Lotion Keri Silky Smooth Lotion  Keri Silky Smooth Sensitive Skin Lotion  Nivea Body Creamy Conditioning  Oil  Nivea Body Extra Enriched Teacher, adult education Moisturizing Lotion Nivea Crme  Nivea Skin Firming Lotion  NutraDerm 30 Skin Lotion  NutraDerm Skin Lotion  NutraDerm Therapeutic Skin Cream  NutraDerm Therapeutic Skin Lotion  ProShield Protective Hand Cream  Provon moisturizing lotion   PATIENT SIGNATURE_________________________________  NURSE SIGNATURE__________________________________  ________________________________________________________________________           View Pre-Surgery Education Videos:  [email or web address]

## 2024-02-02 NOTE — Progress Notes (Addendum)
 COVID Vaccine received:  []  No [x]  Yes Date of any COVID positive Test in last 90 days:  none  PCP - Aloha Arnold, PA-C clearance scanned to [facility] Cardiologist - none  Chest x-ray - 02-16-2023  2v  Epic EKG - 02-18-2023  Epic  Stress Test -  ECHO -  Cardiac Cath -   Pacemaker / ICD device [x]  No []  Yes   Spinal Cord Stimulator:[x]  No []  Yes       History of Sleep Apnea? []  No [x]  Yes   CPAP used?- [x]  No []  Yes  can't tolerate  Does the patient monitor blood sugar?   [x]  N/A   []  No []  Yes  Patient has: [x]  NO Hx DM   []  Pre-DM   []  DM1  []   DM2  Blood Thinner / Instructions:  Eliquis   Hold x ???    Per Amanda Jungling, clearance for 2 day hold given by PCP. Patient is scheduled as spinal, Valinda Gault will discuss with Dr. Bernard Brick. Patient is aware and will call Valinda Gault to clarify 2 or 3 day hold.  Aspirin  Instructions:  none  ERAS Protocol Ordered: []  No  [x]  Yes PRE-SURGERY [x]  ENSURE  []  G2   Patient is to be NPO after:   0700  verbal per Rae Bugler At  PST- patient will sign consent DOS.   Dental hx: []  Dentures:  []  N/A      [x]  Bridge or Partial: top front                  []  Loose or Damaged teeth:   Comments: Patient was given the 5 CHG shower / bath instructions for TKA  surgery along with 2 bottles of the CHG soap. Patient will start this on:  02-09-2024            Activity level: Patient is able to climb a flight of stairs without difficulty; [x]  No CP  [x]  No SOB, but would have leg pain. Patient can  perform ADLs without assistance.   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  Patient verbalized understanding and agreement to the Pre-Surgical Instructions that were given to them at this PAT appointment. Patient was also educated of the need to review these PAT instructions again prior to her surgery.I reviewed the appropriate phone numbers to call if they  have any and questions or concerns.

## 2024-02-03 ENCOUNTER — Encounter (HOSPITAL_COMMUNITY): Payer: Self-pay

## 2024-02-03 ENCOUNTER — Encounter (HOSPITAL_COMMUNITY)
Admission: RE | Admit: 2024-02-03 | Discharge: 2024-02-03 | Disposition: A | Discharge status: Home / Self Care | Source: Ambulatory Visit | Attending: Orthopedic Surgery | Admitting: Orthopedic Surgery

## 2024-02-03 ENCOUNTER — Other Ambulatory Visit: Payer: Self-pay

## 2024-02-03 VITALS — BP 104/62 | HR 68 | Temp 98.3°F | Resp 18 | Ht 64.0 in | Wt 192.0 lb

## 2024-02-03 DIAGNOSIS — Z01812 Encounter for preprocedural laboratory examination: Secondary | ICD-10-CM | POA: Diagnosis not present

## 2024-02-03 DIAGNOSIS — Z01818 Encounter for other preprocedural examination: Secondary | ICD-10-CM

## 2024-02-03 DIAGNOSIS — Z7901 Long term (current) use of anticoagulants: Secondary | ICD-10-CM | POA: Insufficient documentation

## 2024-02-03 HISTORY — DX: Depression, unspecified: F32.A

## 2024-02-03 HISTORY — DX: Pneumonia, unspecified organism: J18.9

## 2024-02-03 LAB — BASIC METABOLIC PANEL WITH GFR
Anion gap: 8 (ref 5–15)
BUN: 22 mg/dL — ABNORMAL HIGH (ref 6–20)
CO2: 21 mmol/L — ABNORMAL LOW (ref 22–32)
Calcium: 9.1 mg/dL (ref 8.9–10.3)
Chloride: 109 mmol/L (ref 98–111)
Creatinine, Ser: 0.9 mg/dL (ref 0.44–1.00)
GFR, Estimated: >60 mL/min (ref >60)
Glucose, Bld: 98 mg/dL (ref 70–99)
Potassium: 4 mmol/L (ref 3.5–5.1)
Sodium: 138 mmol/L (ref 135–145)

## 2024-02-03 LAB — CBC
HCT: 41.5 % (ref 36.0–46.0)
Hemoglobin: 12.8 g/dL (ref 12.0–15.0)
MCH: 27.5 pg (ref 26.0–34.0)
MCHC: 30.8 g/dL (ref 30.0–36.0)
MCV: 89.1 fL (ref 80.0–100.0)
Platelets: 258 10*3/uL (ref 150–400)
RBC: 4.66 MIL/uL (ref 3.87–5.11)
RDW: 14.1 % (ref 11.5–15.5)
WBC: 7.5 10*3/uL (ref 4.0–10.5)
nRBC: 0 % (ref 0.0–0.2)

## 2024-02-03 LAB — SURGICAL PCR SCREEN
MRSA, PCR: NEGATIVE
Staphylococcus aureus: POSITIVE — AB

## 2024-02-03 NOTE — Progress Notes (Signed)
 Patient's PCR screen is positive for  [facility]. Appropriate notes have been placed on the patient's chart. This note has been routed to Dr. Bernard Brick and Kim Pen, PA for review. The Patient's surgery is currently scheduled for: 02-13-24 at [facility].  Morrie Arbour, BSN, CVRN-BC   Pre-Surgical Testing Nurse [facility]  [phone]

## 2024-02-10 ENCOUNTER — Ambulatory Visit
Admission: RE | Admit: 2024-02-10 | Discharge: 2024-02-10 | Disposition: A | Discharge status: Home / Self Care | Source: Ambulatory Visit | Attending: Physician Assistant | Admitting: Physician Assistant

## 2024-02-10 DIAGNOSIS — Z87891 Personal history of nicotine dependence: Secondary | ICD-10-CM

## 2024-02-11 NOTE — H&P (Signed)
 TOTAL KNEE ADMISSION H&P  Patient is being admitted for right total knee arthroplasty.  Therapy Plans: outpatient therapy at [facility] Disposition: Home with best friend and sister/dad Planned DVT Prophylaxis: aspirin  81mg  BID DME needed: none PCP: clearance received TXA: IV Allergies: latex - rash Anesthesia Concerns: none BMI: 33.1 Last HgbA1c: not diabetic   Other: - OSA , CPAP - Percocet 10 mg BID at baseline - will reduce to none vs 5 mg BID - oxycodone  10 mg q4h likely - flexeril, tylenol  NO NSAIDs - on eliquis  Subjective:  Chief Complaint:right knee pain.  HPI: [name], 53 y.o. female, has a history of pain and functional disability in the right knee due to arthritis and has failed non-surgical conservative treatments for greater than 12 weeks to includeNSAID's and/or analgesics, corticosteriod injections, and viscosupplementation injections.  Onset of symptoms was gradual, starting 2 years ago with gradually worsening course since that time. The patient noted prior procedures on the knee to include  arthroscopy and menisectomy on the right knee(s).  Patient currently rates pain in the right knee(s) at 8 out of 10 with activity. Patient has worsening of pain with activity and weight bearing and pain that interferes with activities of daily living.  Patient has evidence of joint space narrowing by imaging studies. There is no active infection.  Patient Active Problem List   Diagnosis Date Noted   H/O: hysterectomy 10/11/2022   Acute medial meniscal tear, left, subsequent encounter 08/16/2020   S/P arthroscopy of left knee 08/16/2020   Pain in left knee 03/15/2020   RUQ pain 04/30/2019   Rectal bleeding 04/30/2019   Chronic anticoagulation 04/30/2019   Non-intractable vomiting 04/30/2019   Somatic dysfunction of right sacroiliac joint 07/08/2018   Pain in limb 09/17/2012   Phlebitis and thrombophlebitis of lower extremities, unspecified 09/17/2012    Past Medical History:  Diagnosis Date   Acute meniscal tear of left knee    Anticoagulant long-term use    xarelto--- managed by pcp   Anxiety    Depression    Family history of adverse reaction to anesthesia    mother--- ponv   GERD (gastroesophageal reflux disease)    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,  takes xarelto   per pt had negative work-up for hypercoagulability   (2012 LLE and recurrent 2014, RLE)   History of gastritis 05/2019   peptic duodenitis   History of kidney stones    Hyperlipidemia    Nephrolithiasis    OA (osteoarthritis)    OSA    no CPAP can't tolerate   Pneumonia    Wears glasses    Wears partial dentures     Past Surgical History:  Procedure Laterality Date   AXILLARY SURGERY Left 09-13-2000  @[facility]    excision left axilla stem   COLONOSCOPY WITH ESOPHAGOGASTRODUODENOSCOPY (EGD)  05-19-2019   dr beavers   CYSTOSCOPY W/ URETERAL STENT PLACEMENT  12-09-2001  @[facility]    CYSTOSCOPY/RETROGRADE/URETEROSCOPY/STONE EXTRACTION WITH BASKET  03-08-2000 @ [facility]   DX LAPAROSCOPY W/ LYSIS ADHESIONS AND DILATATION / CURRETTAGE  08-11-2003  @[facility]    KNEE ARTHROSCOPY Right 07/2023   KNEE ARTHROSCOPY WITH MEDIAL MENISECTOMY Left 08/16/2020   Procedure: LEFT KNEE ARTHROSCOPY WITH PARTIAL MEDIAL MENISECTOMY AND MEDIAL PATELLOFEMORAL CHONDROPLASTY;  Surgeon: Claiborne Crew, MD;  Location: [facility] ;  Service: Orthopedics;  Laterality: Left;  45 mins   LAPAROSCOPY W/ LYSIS ADHESIONS/ UTEROSACRAL NERVE LIGATION / DRAINAGE LEFT OVARIAN CYST  07-13-2002  @[facility]   SHOULDER ARTHROSCOPY Left 2010   TOE SURGERY Right 2016   removal spur great toe   TOTAL ABDOMINAL HYSTERECTOMY W/ BILATERAL SALPINGOOPHORECTOMY  10-26-2003  @[facility]    WRIST SURGERY Right child   complex laceration    No current facility-administered medications for this encounter.   Current Outpatient Medications  Medication Sig Dispense Refill Last Dose/Taking   clonazePAM (KLONOPIN) 0.5 MG  tablet Take 0.5 mg by mouth daily as needed for anxiety.   Taking As Needed   ELIQUIS 5 MG TABS tablet Take 5 mg by mouth 2 (two) times daily.   Taking   oxyCODONE -acetaminophen  (PERCOCET) 10-325 MG tablet Take 0.5-1 tablets by mouth 2 (two) times daily as needed for pain.   Taking As Needed   pantoprazole  (PROTONIX ) 40 MG tablet Take 40 mg by mouth every evening.   Taking   sertraline (ZOLOFT) 100 MG tablet Take 150 mg by mouth every evening.   Taking   zolpidem (AMBIEN) 10 MG tablet Take 5-10 mg by mouth at bedtime as needed for sleep.   Taking As Needed   rosuvastatin (CRESTOR) 20 MG tablet Take 20 mg by mouth daily.      Allergies  Allergen Reactions   Hydrocodone Itching   Latex Itching    Social History   Tobacco Use   Smoking status: Former    Types: Cigarettes    Start date: 1990   Smokeless tobacco: Never   Tobacco comments:    Stopped 2 years  Substance Use Topics   Alcohol use: No    Family History  Problem Relation Age of Onset   Deep vein thrombosis Mother    Other Mother        varicose veins   Colon polyps Mother        not cancerous   Colon polyps Father        said he had bowel problems, precancerous   COPD Father    Hyperlipidemia Brother    Hypertension Brother    Heart attack Brother    Other Sister        sad sister had bowel problems   Lung cancer Maternal Aunt    Colon cancer Neg Hx    Esophageal cancer Neg Hx    Rectal cancer Neg Hx    Stomach cancer Neg Hx      Review of Systems  Constitutional:  Negative for chills and fever.  Respiratory:  Negative for cough and shortness of breath.   Cardiovascular:  Negative for chest pain.  Gastrointestinal:  Negative for nausea and vomiting.  Musculoskeletal:  Positive for arthralgias.     Objective:  Physical Exam Well nourished and well developed. General: Alert and oriented x3, cooperative and pleasant, no acute distress.  Musculoskeletal:  Right knee exam: Her portal sites are  well-healed without signs of infection No palpable effusion Tenderness mainly over the medial aspect the knee Slight flexion contracture due to pain with flexion to 110 degrees Normal ipsilateral right hip exam without groin pain or referred pain  Vital signs in last 24 hours:    Labs:   Estimated body mass index is 32.96 kg/m as calculated from the following:   Height as of 02/03/24: 5\' 4"  (1.626 m).   Weight as of 02/03/24: 87.1 kg.   Imaging Review Plain radiographs demonstrate severe degenerative joint disease of the right knee(s). The overall alignment isneutral. The bone quality appears to be adequate for age and reported activity level.      Assessment/Plan:  End stage arthritis, right knee   The patient history, physical examination, clinical judgment of the provider and imaging studies are consistent with end stage degenerative joint disease of the right knee(s) and total knee arthroplasty is deemed medically necessary. The treatment options including medical management, injection therapy arthroscopy and arthroplasty were discussed at length. The risks and benefits of total knee arthroplasty were presented and reviewed. The risks due to aseptic loosening, infection, stiffness, patella tracking problems, thromboembolic complications and other imponderables were discussed. The patient acknowledged the explanation, agreed to proceed with the plan and consent was signed. Patient is being admitted for inpatient treatment for surgery, pain control, PT, OT, prophylactic antibiotics, VTE prophylaxis, progressive ambulation and ADL's and discharge planning. The patient is planning to be discharged  home.     Patient's anticipated LOS is less than 2 midnights, meeting these requirements: - Younger than 73 - Lives within 1 hour of care - Has a competent adult at home to recover with post-op recover - NO history of  - Chronic pain requiring opiods  - Diabetes  - Coronary Artery  Disease  - Heart failure  - Heart attack  - Stroke  - DVT/VTE  - Cardiac arrhythmia  - Respiratory Failure/COPD  - Renal failure  - Anemia  - Advanced Liver disease  Kim Pen, PA-C Orthopedic Surgery [facility] [place] [phone]

## 2024-02-12 NOTE — Anesthesia Preprocedure Evaluation (Addendum)
 Anesthesia Evaluation  Patient identified by MRN, date of birth, ID band Patient awake    Reviewed: Allergy & Precautions, H&P , NPO status , Patient's Chart, lab work & pertinent test results  Airway Mallampati: III  TM Distance: >3 FB Neck ROM: Full    Dental no notable dental hx. (+) Partial Upper, Dental Advisory Given   Pulmonary sleep apnea , former smoker   Pulmonary exam normal breath sounds clear to auscultation       Cardiovascular Exercise Tolerance: Good negative cardio ROS  Rhythm:Regular Rate:Normal     Neuro/Psych   Anxiety Depression    negative neurological ROS     GI/Hepatic Neg liver ROS,GERD  Medicated,,  Endo/Other  negative endocrine ROS    Renal/GU negative Renal ROS  negative genitourinary   Musculoskeletal  (+) Arthritis , Osteoarthritis,    Abdominal   Peds  Hematology negative hematology ROS (+)   Anesthesia Other Findings   Reproductive/Obstetrics negative OB ROS                             Anesthesia Physical Anesthesia Plan  ASA: 3  Anesthesia Plan: Spinal   Post-op Pain Management: Regional block* and Tylenol  PO (pre-op)*   Induction: Intravenous  PONV Risk Score and Plan: 3 and Ondansetron , Dexamethasone , Propofol  infusion and Midazolam   Airway Management Planned: Natural Airway and Simple Face Mask  Additional Equipment:   Intra-op Plan:   Post-operative Plan:   Informed Consent: I have reviewed the patients History and Physical, chart, labs and discussed the procedure including the risks, benefits and alternatives for the proposed anesthesia with the patient or authorized representative who has indicated his/her understanding and acceptance.     Dental advisory given  Plan Discussed with: CRNA  Anesthesia Plan Comments:        Anesthesia Quick Evaluation

## 2024-02-13 ENCOUNTER — Ambulatory Visit (HOSPITAL_BASED_OUTPATIENT_CLINIC_OR_DEPARTMENT_OTHER): Payer: Self-pay | Admitting: Anesthesiology

## 2024-02-13 ENCOUNTER — Encounter (HOSPITAL_COMMUNITY): Payer: Self-pay | Admitting: Orthopedic Surgery

## 2024-02-13 ENCOUNTER — Ambulatory Visit (HOSPITAL_COMMUNITY)
Admission: RE | Admit: 2024-02-13 | Discharge: 2024-02-13 | Disposition: A | Discharge status: Home / Self Care | Attending: Orthopedic Surgery | Admitting: Orthopedic Surgery

## 2024-02-13 ENCOUNTER — Other Ambulatory Visit: Payer: Self-pay

## 2024-02-13 ENCOUNTER — Encounter (HOSPITAL_COMMUNITY)
Admission: RE | Disposition: A | Discharge status: Home / Self Care | Payer: Self-pay | Source: Home / Self Care | Attending: Orthopedic Surgery

## 2024-02-13 ENCOUNTER — Ambulatory Visit (HOSPITAL_COMMUNITY): Payer: Self-pay | Admitting: Anesthesiology

## 2024-02-13 DIAGNOSIS — Z87891 Personal history of nicotine dependence: Secondary | ICD-10-CM | POA: Insufficient documentation

## 2024-02-13 DIAGNOSIS — Z96651 Presence of right artificial knee joint: Secondary | ICD-10-CM

## 2024-02-13 DIAGNOSIS — K219 Gastro-esophageal reflux disease without esophagitis: Secondary | ICD-10-CM | POA: Insufficient documentation

## 2024-02-13 DIAGNOSIS — F32A Depression, unspecified: Secondary | ICD-10-CM | POA: Insufficient documentation

## 2024-02-13 DIAGNOSIS — Z79899 Other long term (current) drug therapy: Secondary | ICD-10-CM | POA: Insufficient documentation

## 2024-02-13 DIAGNOSIS — M1711 Unilateral primary osteoarthritis, right knee: Secondary | ICD-10-CM | POA: Diagnosis present

## 2024-02-13 DIAGNOSIS — F418 Other specified anxiety disorders: Secondary | ICD-10-CM | POA: Diagnosis not present

## 2024-02-13 DIAGNOSIS — G4730 Sleep apnea, unspecified: Secondary | ICD-10-CM

## 2024-02-13 DIAGNOSIS — G4733 Obstructive sleep apnea (adult) (pediatric): Secondary | ICD-10-CM | POA: Insufficient documentation

## 2024-02-13 DIAGNOSIS — F419 Anxiety disorder, unspecified: Secondary | ICD-10-CM | POA: Diagnosis not present

## 2024-02-13 DIAGNOSIS — Z7901 Long term (current) use of anticoagulants: Secondary | ICD-10-CM | POA: Insufficient documentation

## 2024-02-13 HISTORY — PX: TOTAL KNEE ARTHROPLASTY: SHX125

## 2024-02-13 SURGERY — ARTHROPLASTY, KNEE, TOTAL
Anesthesia: Spinal | Site: Knee | Laterality: Right

## 2024-02-13 MED ORDER — BUPIVACAINE-EPINEPHRINE (PF) 0.5% -1:200000 IJ SOLN
INTRAMUSCULAR | Status: DC | PRN
  Administered 2024-02-13: 20 mL via PERINEURAL

## 2024-02-13 MED ORDER — ORAL CARE MOUTH RINSE: 15.0000 mL | Freq: Once | OROMUCOSAL | Status: AC

## 2024-02-13 MED ORDER — CEFAZOLIN SODIUM-DEXTROSE 2-4 GM/100ML-% IV SOLN
2.0000 g | Freq: Four times a day (QID) | INTRAVENOUS | Status: DC
  Administered 2024-02-13: 2 g via INTRAVENOUS

## 2024-02-13 MED ORDER — KETOROLAC TROMETHAMINE 30 MG/ML IJ SOLN
INTRAMUSCULAR | Status: AC
  Filled 2024-02-13: qty 1

## 2024-02-13 MED ORDER — LACTATED RINGERS IV SOLN: INTRAVENOUS | Status: DC

## 2024-02-13 MED ORDER — CHLORHEXIDINE GLUCONATE 0.12 % MT SOLN
15.0000 mL | Freq: Once | OROMUCOSAL | Status: AC
  Administered 2024-02-13: 15 mL via OROMUCOSAL

## 2024-02-13 MED ORDER — ACETAMINOPHEN 500 MG PO TABS
1000.0000 mg | ORAL_TABLET | Freq: Four times a day (QID) | ORAL | Status: DC
  Administered 2024-02-13: 1000 mg via ORAL

## 2024-02-13 MED ORDER — PROPOFOL 1000 MG/100ML IV EMUL
INTRAVENOUS | Status: AC
  Filled 2024-02-13: qty 100

## 2024-02-13 MED ORDER — FENTANYL CITRATE (PF) 100 MCG/2ML IJ SOLN
INTRAMUSCULAR | Status: AC
  Filled 2024-02-13: qty 2

## 2024-02-13 MED ORDER — HYDROMORPHONE HCL 1 MG/ML IJ SOLN
0.5000 mg | INTRAMUSCULAR | Status: DC | PRN
Start: 2024-02-13 — End: 2024-02-13

## 2024-02-13 MED ORDER — CHLORHEXIDINE GLUCONATE 4 % EX SOLN: 1.0000 | CUTANEOUS | 1 refills | Status: AC

## 2024-02-13 MED ORDER — METOCLOPRAMIDE HCL 5 MG PO TABS: 5.0000 mg | ORAL_TABLET | Freq: Three times a day (TID) | ORAL | Status: DC | PRN

## 2024-02-13 MED ORDER — POVIDONE-IODINE 10 % EX SWAB
2.0000 | Freq: Once | CUTANEOUS | Status: AC
  Administered 2024-02-13: 2 via TOPICAL

## 2024-02-13 MED ORDER — OXYCODONE HCL 5 MG PO TABS: 10.0000 mg | ORAL_TABLET | ORAL | Status: DC | PRN

## 2024-02-13 MED ORDER — ONDANSETRON HCL 4 MG PO TABS: 4.0000 mg | ORAL_TABLET | Freq: Four times a day (QID) | ORAL | Status: DC | PRN

## 2024-02-13 MED ORDER — METOCLOPRAMIDE HCL 5 MG/ML IJ SOLN: 5.0000 mg | Freq: Three times a day (TID) | INTRAMUSCULAR | Status: DC | PRN

## 2024-02-13 MED ORDER — CEFAZOLIN SODIUM-DEXTROSE 2-4 GM/100ML-% IV SOLN
INTRAVENOUS | Status: AC
  Filled 2024-02-13: qty 100

## 2024-02-13 MED ORDER — DEXAMETHASONE SODIUM PHOSPHATE 10 MG/ML IJ SOLN: 8.0000 mg | Freq: Once | INTRAMUSCULAR | Status: DC

## 2024-02-13 MED ORDER — MIDAZOLAM HCL 2 MG/2ML IJ SOLN
INTRAMUSCULAR | Status: AC
  Filled 2024-02-13: qty 2

## 2024-02-13 MED ORDER — PROPOFOL 500 MG/50ML IV EMUL
INTRAVENOUS | Status: DC | PRN
  Administered 2024-02-13: 70 ug/kg/min via INTRAVENOUS
  Administered 2024-02-13: 10 mg via INTRAVENOUS
  Administered 2024-02-13: 15 mg via INTRAVENOUS

## 2024-02-13 MED ORDER — 0.9 % SODIUM CHLORIDE (POUR BTL) OPTIME
TOPICAL | Status: DC | PRN
Start: 2024-02-13 — End: 2024-02-13
  Administered 2024-02-13: 1000 mL

## 2024-02-13 MED ORDER — CYCLOBENZAPRINE HCL 10 MG PO TABS: 10.0000 mg | ORAL_TABLET | Freq: Three times a day (TID) | ORAL | 2 refills | Status: AC | PRN

## 2024-02-13 MED ORDER — PHENYLEPHRINE 80 MCG/ML (10ML) SYRINGE FOR IV PUSH (FOR BLOOD PRESSURE SUPPORT)
PREFILLED_SYRINGE | INTRAVENOUS | Status: DC | PRN
Start: 2024-02-13 — End: 2024-02-13
  Administered 2024-02-13 (×2): 80 ug via INTRAVENOUS

## 2024-02-13 MED ORDER — ONDANSETRON HCL 4 MG/2ML IJ SOLN: 4.0000 mg | Freq: Four times a day (QID) | INTRAMUSCULAR | Status: DC | PRN

## 2024-02-13 MED ORDER — MIDAZOLAM HCL 5 MG/5ML IJ SOLN
INTRAMUSCULAR | Status: DC | PRN
  Administered 2024-02-13 (×2): 1 mg via INTRAVENOUS

## 2024-02-13 MED ORDER — KETOROLAC TROMETHAMINE 30 MG/ML IJ SOLN
INTRAMUSCULAR | Status: DC | PRN
  Administered 2024-02-13: 30 mg

## 2024-02-13 MED ORDER — STERILE WATER FOR IRRIGATION IR SOLN
Status: DC | PRN
  Administered 2024-02-13: 2000 mL

## 2024-02-13 MED ORDER — SODIUM CHLORIDE (PF) 0.9 % IJ SOLN
INTRAMUSCULAR | Status: DC | PRN
  Administered 2024-02-13: 30 mL

## 2024-02-13 MED ORDER — OXYCODONE HCL 5 MG PO TABS
5.0000 mg | ORAL_TABLET | ORAL | Status: DC | PRN
Start: 2024-02-13 — End: 2024-02-13
  Administered 2024-02-13: 5 mg via ORAL

## 2024-02-13 MED ORDER — TRANEXAMIC ACID-NACL 1000-0.7 MG/100ML-% IV SOLN
1000.0000 mg | Freq: Once | INTRAVENOUS | Status: AC
  Administered 2024-02-13: 1000 mg via INTRAVENOUS

## 2024-02-13 MED ORDER — MUPIROCIN 2 % EX OINT: 1.0000 | TOPICAL_OINTMENT | Freq: Two times a day (BID) | CUTANEOUS | 0 refills | Status: AC

## 2024-02-13 MED ORDER — LIDOCAINE HCL (PF) 2 % IJ SOLN
INTRAMUSCULAR | Status: AC
  Filled 2024-02-13: qty 5

## 2024-02-13 MED ORDER — SENNA 8.6 MG PO TABS: 1.0000 | ORAL_TABLET | Freq: Every day | ORAL | 0 refills | Status: AC

## 2024-02-13 MED ORDER — TRANEXAMIC ACID-NACL 1000-0.7 MG/100ML-% IV SOLN
INTRAVENOUS | Status: AC
  Filled 2024-02-13: qty 100

## 2024-02-13 MED ORDER — SODIUM CHLORIDE 0.9 % IR SOLN
Status: DC | PRN
  Administered 2024-02-13: 1000 mL

## 2024-02-13 MED ORDER — TRANEXAMIC ACID-NACL 1000-0.7 MG/100ML-% IV SOLN
1000.0000 mg | INTRAVENOUS | Status: AC
  Administered 2024-02-13: 1000 mg via INTRAVENOUS
  Filled 2024-02-13: qty 100

## 2024-02-13 MED ORDER — OXYCODONE HCL 5 MG PO TABS
ORAL_TABLET | ORAL | Status: AC
  Filled 2024-02-13: qty 1

## 2024-02-13 MED ORDER — POLYETHYLENE GLYCOL 3350 17 G PO PACK: 17.0000 g | PACK | Freq: Two times a day (BID) | ORAL | Status: AC

## 2024-02-13 MED ORDER — FENTANYL CITRATE PF 50 MCG/ML IJ SOSY: 25.0000 ug | PREFILLED_SYRINGE | INTRAMUSCULAR | Status: DC | PRN

## 2024-02-13 MED ORDER — EPHEDRINE 5 MG/ML INJ
INTRAVENOUS | Status: AC
  Filled 2024-02-13: qty 5

## 2024-02-13 MED ORDER — CEFAZOLIN SODIUM-DEXTROSE 2-4 GM/100ML-% IV SOLN
2.0000 g | INTRAVENOUS | Status: AC
Start: 2024-02-13 — End: 2024-02-13
  Administered 2024-02-13: 2 g via INTRAVENOUS
  Filled 2024-02-13: qty 100

## 2024-02-13 MED ORDER — METHOCARBAMOL 500 MG PO TABS
ORAL_TABLET | ORAL | Status: AC
  Filled 2024-02-13: qty 1

## 2024-02-13 MED ORDER — METHOCARBAMOL 500 MG PO TABS
500.0000 mg | ORAL_TABLET | Freq: Four times a day (QID) | ORAL | Status: DC | PRN
  Administered 2024-02-13: 500 mg via ORAL

## 2024-02-13 MED ORDER — PHENYLEPHRINE 80 MCG/ML (10ML) SYRINGE FOR IV PUSH (FOR BLOOD PRESSURE SUPPORT)
PREFILLED_SYRINGE | INTRAVENOUS | Status: AC
  Filled 2024-02-13: qty 10

## 2024-02-13 MED ORDER — OXYCODONE HCL 5 MG PO TABS
5.0000 mg | ORAL_TABLET | ORAL | 0 refills | Status: AC | PRN
Start: 2024-02-13 — End: ?

## 2024-02-13 MED ORDER — EPHEDRINE SULFATE-NACL 50-0.9 MG/10ML-% IV SOSY
PREFILLED_SYRINGE | INTRAVENOUS | Status: DC | PRN
  Administered 2024-02-13: 10 mg via INTRAVENOUS
  Administered 2024-02-13 (×3): 5 mg via INTRAVENOUS

## 2024-02-13 MED ORDER — LIDOCAINE HCL (PF) 2 % IJ SOLN
INTRAMUSCULAR | Status: DC | PRN
  Administered 2024-02-13: 30 mg via INTRADERMAL

## 2024-02-13 MED ORDER — ACETAMINOPHEN 500 MG PO TABS
ORAL_TABLET | ORAL | Status: AC
  Filled 2024-02-13: qty 2

## 2024-02-13 MED ORDER — ONDANSETRON HCL 4 MG/2ML IJ SOLN
INTRAMUSCULAR | Status: DC | PRN
  Administered 2024-02-13: 4 mg via INTRAVENOUS

## 2024-02-13 MED ORDER — METHOCARBAMOL 1000 MG/10ML IJ SOLN: 500.0000 mg | Freq: Four times a day (QID) | INTRAMUSCULAR | Status: DC | PRN

## 2024-02-13 MED ORDER — BUPIVACAINE-EPINEPHRINE (PF) 0.25% -1:200000 IJ SOLN
INTRAMUSCULAR | Status: AC
  Filled 2024-02-13: qty 30

## 2024-02-13 MED ORDER — FENTANYL CITRATE (PF) 100 MCG/2ML IJ SOLN
INTRAMUSCULAR | Status: DC | PRN
  Administered 2024-02-13 (×2): 25 ug via INTRAVENOUS

## 2024-02-13 MED ORDER — SODIUM CHLORIDE (PF) 0.9 % IJ SOLN
INTRAMUSCULAR | Status: AC
  Filled 2024-02-13: qty 50

## 2024-02-13 MED ORDER — BUPIVACAINE-EPINEPHRINE (PF) 0.25% -1:200000 IJ SOLN
INTRAMUSCULAR | Status: DC | PRN
  Administered 2024-02-13: 30 mL

## 2024-02-13 SURGICAL SUPPLY — 45 items
ATTUNE MED ANAT PAT 32 KNEE (Knees) IMPLANT
BAG COUNTER SPONGE SURGICOUNT (BAG) IMPLANT
BAG ZIPLOCK 12X15 (MISCELLANEOUS) ×1 IMPLANT
BASEPLATE TIB CMT FB PCKT SZ4 (Stem) IMPLANT
BLADE SAW SGTL 13.0X1.19X90.0M (BLADE) ×1 IMPLANT
BNDG ELASTIC 6INX 5YD STR LF (GAUZE/BANDAGES/DRESSINGS) ×1 IMPLANT
BNDG ELASTIC 6X10 VLCR STRL LF (GAUZE/BANDAGES/DRESSINGS) IMPLANT
BOWL SMART MIX CTS (DISPOSABLE) ×1 IMPLANT
CEMENT HV SMART SET (Cement) ×2 IMPLANT
COMPONENT FEM CMT ATTUNE 3 RT (Joint) IMPLANT
COVER SURGICAL LIGHT HANDLE (MISCELLANEOUS) ×1 IMPLANT
CUFF TRNQT CYL 34X4.125X (TOURNIQUET CUFF) ×1 IMPLANT
DERMABOND ADVANCED .7 DNX12 (GAUZE/BANDAGES/DRESSINGS) ×1 IMPLANT
DRAPE U-SHAPE 47X51 STRL (DRAPES) ×1 IMPLANT
DRESSING AQUACEL AG SP 3.5X10 (GAUZE/BANDAGES/DRESSINGS) ×1 IMPLANT
DURAPREP 26ML APPLICATOR (WOUND CARE) ×2 IMPLANT
ELECT REM PT RETURN 15FT ADLT (MISCELLANEOUS) ×1 IMPLANT
GLOVE BIO SURGEON STRL SZ 6 (GLOVE) ×1 IMPLANT
GLOVE BIOGEL PI IND STRL 6.5 (GLOVE) ×1 IMPLANT
GLOVE BIOGEL PI IND STRL 7.5 (GLOVE) ×1 IMPLANT
GLOVE ORTHO TXT STRL SZ7.5 (GLOVE) ×2 IMPLANT
GOWN STRL REUS W/ TWL LRG LVL3 (GOWN DISPOSABLE) ×2 IMPLANT
HOLDER FOLEY CATH W/STRAP (MISCELLANEOUS) IMPLANT
INSERT TIB CMT ATTUNE 3 5 RT (Insert) IMPLANT
KIT TURNOVER KIT A (KITS) ×1 IMPLANT
MANIFOLD NEPTUNE II (INSTRUMENTS) ×1 IMPLANT
NDL SAFETY ECLIPSE 18X1.5 (NEEDLE) IMPLANT
NS IRRIG 1000ML POUR BTL (IV SOLUTION) ×1 IMPLANT
PACK TOTAL KNEE CUSTOM (KITS) ×1 IMPLANT
PENCIL SMOKE EVACUATOR (MISCELLANEOUS) ×1 IMPLANT
PIN FIX SIGMA LCS THRD HI (PIN) IMPLANT
PROTECTOR NERVE ULNAR (MISCELLANEOUS) ×1 IMPLANT
SET HNDPC FAN SPRY TIP SCT (DISPOSABLE) ×1 IMPLANT
SET PAD KNEE POSITIONER (MISCELLANEOUS) ×1 IMPLANT
SPIKE FLUID TRANSFER (MISCELLANEOUS) ×2 IMPLANT
SUT MNCRL AB 4-0 PS2 18 (SUTURE) ×1 IMPLANT
SUT STRATAFIX PDS+ 0 24IN (SUTURE) ×1 IMPLANT
SUT VIC AB 1 CT1 36 (SUTURE) ×1 IMPLANT
SUT VIC AB 2-0 CT1 TAPERPNT 27 (SUTURE) ×2 IMPLANT
SYR 3ML LL SCALE MARK (SYRINGE) ×1 IMPLANT
TOWEL GREEN STERILE FF (TOWEL DISPOSABLE) ×1 IMPLANT
TRAY FOLEY MTR SLVR 16FR STAT (SET/KITS/TRAYS/PACK) ×1 IMPLANT
TUBE SUCTION HIGH CAP CLEAR NV (SUCTIONS) ×1 IMPLANT
WATER STERILE IRR 1000ML POUR (IV SOLUTION) ×2 IMPLANT
WRAP KNEE MAXI GEL POST OP (GAUZE/BANDAGES/DRESSINGS) ×1 IMPLANT

## 2024-02-13 NOTE — Anesthesia Postprocedure Evaluation (Signed)
 Anesthesia Post Note  Patient: Kiara Rada Buerger  Procedure(s) Performed: ARTHROPLASTY, KNEE, TOTAL (Right: Knee)     Patient location during evaluation: PACU Anesthesia Type: Spinal and Regional Level of consciousness: oriented and awake and alert Pain management: pain level controlled Vital Signs Assessment: post-procedure vital signs reviewed and stable Respiratory status: spontaneous breathing and respiratory function stable Cardiovascular status: blood pressure returned to baseline and stable Postop Assessment: no headache, no backache, no apparent nausea or vomiting, spinal receding and patient able to bend at knees Anesthetic complications: no  No notable events documented.  Last Vitals:  Vitals:   02/13/24 1030 02/13/24 1045  BP: 107/71 130/71  Pulse: (!) 56 60  Resp: 10 11  Temp:    SpO2: 95% 93%    Last Pain:  Vitals:   02/13/24 1045  TempSrc:   PainSc: 0-No pain                 Samora Jernberg,W. EDMOND

## 2024-02-13 NOTE — Transfer of Care (Signed)
 Immediate Anesthesia Transfer of Care Note  Patient: [name]  Procedure(s) Performed: ARTHROPLASTY, KNEE, TOTAL (Right: Knee)  Patient Location: PACU  Anesthesia Type:MAC and Spinal  Level of Consciousness: awake, alert , oriented, and patient cooperative  Airway & Oxygen Therapy: Patient Spontanous Breathing and Patient connected to face mask oxygen  Post-op Assessment: Report given to RN and Post -op Vital signs reviewed and stable  Post vital signs: Reviewed and stable  Last Vitals:  Vitals Value Taken Time  BP 130/77 02/13/24 0915  Temp 36 C 02/13/24 0915  Pulse 63 02/13/24 0917  Resp 13 02/13/24 0917  SpO2 100 % 02/13/24 0917  Vitals shown include unfiled device data.  Last Pain:  Vitals:   02/13/24 0915  TempSrc:   PainSc: 0-No pain         Complications: No notable events documented.

## 2024-02-13 NOTE — Evaluation (Signed)
 Physical Therapy Evaluation Patient Details Name: [name] MRN: [id number] DOB: Feb 11, 1971 Today's Date: 02/13/2024  History of Present Illness  Pt s/p R TKR  Clinical Impression  Pt s/p R TKR and presents with functional mobility limitations 2* decreased R LE strength/ROM and post op pain.  This date, pt performed HEP with assist and with written program provided.  OOB deferred with pt report of residual numbness/tingling bil feet and buttocks.  Pt should progress to dc home with family assist and reports first OP PT scheduled 02/17/24.      If plan is discharge home, recommend the following: A little help with walking and/or transfers;A little help with bathing/dressing/bathroom;Assistance with cooking/housework;Assist for transportation;Help with stairs or ramp for entrance   Can travel by private vehicle        Equipment Recommendations None recommended by PT  Recommendations for Other Services       Functional Status Assessment Patient has had a recent decline in their functional status and demonstrates the ability to make significant improvements in function in a reasonable and predictable amount of time.     Precautions / Restrictions Precautions Precautions: Fall;Knee Restrictions Weight Bearing Restrictions Per Provider Order: No Other Position/Activity Restrictions: WBAT      Mobility  Bed Mobility               General bed mobility comments: deferred with pt report of residual numbness/tingling feet and buttocks    Transfers                        Ambulation/Gait                  Stairs            Wheelchair Mobility     Tilt Bed    Modified Rankin (Stroke Patients Only)       Balance                                             Pertinent Vitals/Pain Pain Assessment Pain Assessment: 0-10 Pain Score: 4  Pain Location: R knee Pain Descriptors / Indicators: Aching, Sore Pain Intervention(s):  Limited activity within patient's tolerance, Monitored during session, Premedicated before session, Ice applied, Patient requesting pain meds-RN notified, RN gave pain meds during session    Home Living Family/patient expects to be discharged to:: Private residence Living Arrangements: Alone Available Help at Discharge: Family;Available 24 hours/day Type of Home: House Home Access: Stairs to enter Entrance Stairs-Rails: Right Entrance Stairs-Number of Steps: 2   Home Layout: One level Home Equipment: Agricultural consultant (2 wheels);Cane - single point;Shower seat;BSC/3in1      Prior Function Prior Level of Function : Independent/Modified Independent                     Extremity/Trunk Assessment   Upper Extremity Assessment Upper Extremity Assessment: Overall WFL for tasks assessed    Lower Extremity Assessment Lower Extremity Assessment: RLE deficits/detail RLE Deficits / Details: 3-/5 quads with AAROM at knee -5 - 55       Communication   Communication Communication: No apparent difficulties    Cognition Arousal: Alert Behavior During Therapy: WFL for tasks assessed/performed   PT - Cognitive impairments: No apparent impairments  Following commands: Intact       Cueing       General Comments      Exercises Total Joint Exercises Ankle Circles/Pumps: AROM, Both, 15 reps, Supine Quad Sets: AROM, Both, 10 reps, Supine Heel Slides: AAROM, Right, 10 reps, Supine Straight Leg Raises: AAROM, AROM, Right, 10 reps, Supine   Assessment/Plan    PT Assessment Patient needs continued PT services  PT Problem List Decreased strength;Decreased range of motion;Decreased activity tolerance;Decreased balance;Decreased mobility;Decreased knowledge of use of DME;Pain       PT Treatment Interventions DME instruction;Gait training;Stair training;Functional mobility training;Therapeutic activities;Therapeutic exercise;Patient/family  education    PT Goals (Current goals can be found in the Care Plan section)  Acute Rehab PT Goals Patient Stated Goal: Regain IND PT Goal Formulation: With patient Time For Goal Achievement: 02/20/24 Potential to Achieve Goals: Good    Frequency 7X/week     Co-evaluation               AM-PAC PT "6 Clicks" Mobility  Outcome Measure Help needed turning from your back to your side while in a flat bed without using bedrails?: A Little Help needed moving from lying on your back to sitting on the side of a flat bed without using bedrails?: A Little Help needed moving to and from a bed to a chair (including a wheelchair)?: A Little Help needed standing up from a chair using your arms (e.g., wheelchair or bedside chair)?: A Little Help needed to walk in hospital room?: A Little Help needed climbing 3-5 steps with a railing? : A Lot 6 Click Score: 17    End of Session Equipment Utilized During Treatment: Gait belt Activity Tolerance: Patient tolerated treatment well Patient left: in bed;with call bell/phone within reach;with family/visitor present Nurse Communication: Mobility status PT Visit Diagnosis: Difficulty in walking, not elsewhere classified (R26.2);Pain Pain - Right/Left: Right Pain - part of body: Knee    Time: 1226-1247 PT Time Calculation (min) (ACUTE ONLY): 21 min   Charges:   PT Evaluation $PT Eval Low Complexity: 1 Low   PT General Charges $$ ACUTE PT VISIT: 1 Visit         Thedora Finlay PT Acute Rehabilitation Services Pager [phone] Office [phone]   Burnis Halling 02/13/2024, 1:28 PM

## 2024-02-13 NOTE — Discharge Instructions (Signed)

## 2024-02-13 NOTE — Anesthesia Procedure Notes (Signed)
 Anesthesia Regional Block: Adductor canal block   Pre-Anesthetic Checklist: , timeout performed,  Correct Patient, Correct Site, Correct Laterality,  Correct Procedure, Correct Position, site marked,  Risks and benefits discussed,  Pre-op evaluation,  At surgeon's request and post-op pain management  Laterality: Right  Prep: Maximum Sterile Barrier Precautions used, chloraprep       Needles:  Injection technique: Single-shot  Needle Type: Echogenic Stimulator Needle     Needle Length: 9cm  Needle Gauge: 21     Additional Needles:   Procedures:,,,, ultrasound used (permanent image in chart),,    Narrative:  Start time: 02/13/2024 7:08 AM End time: 02/13/2024 7:13 AM Injection made incrementally with aspirations every 5 mL.  Performed by: Personally  Anesthesiologist: Jake Mayers, MD

## 2024-02-13 NOTE — Op Note (Signed)
 NAME:  [name]                      MEDICAL RECORD NO.:  [id number]                             FACILITY:  [facility]      PHYSICIAN:  Azalea Lento. Bernard Brick, M.D.  DATE OF BIRTH:  October 17, 1970      DATE OF PROCEDURE:  02/13/2024                                     OPERATIVE REPORT         PREOPERATIVE DIAGNOSIS:  Right knee osteoarthritis.      POSTOPERATIVE DIAGNOSIS:  Right knee osteoarthritis.      FINDINGS:  The patient was noted to have complete loss of cartilage and   bone-on-bone arthritis with associated osteophytes in the medial and patellofemoral compartments of   the knee with a significant synovitis and associated effusion.  The patient had failed months of conservative treatment including medications, injection therapy, activity modification.     PROCEDURE:  Right total knee replacement.      COMPONENTS USED:  DePuy Attune FB CR MS knee   system, a size 3 femur, 4 tibia, size 5 mm CR MS AOX insert, and 32 anatomic patellar   button.      SURGEON:  Azalea Lento. Bernard Brick, M.D.   Assistant: Surgical team     ANESTHESIA:  Regional and Spinal.      SPECIMENS:  None.      COMPLICATION:  None.      DRAINS:  None.  EBL: <100 cc      TOURNIQUET TIME:   Total Tourniquet Time Documented: Thigh (Right) - 35 minutes Total: Thigh (Right) - 35 minutes  .      The patient was stable to the recovery room.      INDICATION FOR PROCEDURE:  [name] is a 53 y.o. female patient of   mine.  The patient had been seen, evaluated, and treated for months conservatively in the   office with medication, activity modification, and injections.  The patient had   radiographic changes of bone-on-bone arthritis with endplate sclerosis and osteophytes noted.  Based on the radiographic changes and failed conservative measures, the patient   decided to proceed with definitive treatment, total knee replacement.  Risks of infection, DVT, component failure, need for revision surgery, neurovascular  injury were reviewed in the office setting.  The postop course was reviewed stressing the efforts to maximize post-operative satisfaction and function.  Consent was obtained for benefit of pain   relief.      PROCEDURE IN DETAIL:  The patient was brought to the operative theater.   Once adequate anesthesia, preoperative antibiotics, 2 gm of Ancef ,1 gm of Tranexamic Acid , and 10 mg of Decadron  administered, the patient was positioned supine with a right thigh tourniquet placed.  The  right lower extremity was prepped and draped in sterile fashion.  A time-   out was performed identifying the patient, planned procedure, and the appropriate extremity.      The right lower extremity was placed in the [facility] leg holder.  The leg was   exsanguinated, tourniquet elevated to 225 mmHg.  A midline incision was   made followed by median parapatellar arthrotomy.  Following initial   exposure, attention was first directed to the patella.  Precut   measurement was noted to be 22 mm.  I resected down to 13 mm and used a   32 anatomic patellar button to restore patellar height as well as cover the cut surface.      The lug holes were drilled and a metal shim was placed to protect the   patella from retractors and saw blade during the procedure.      At this point, attention was now directed to the femur.  The femoral   canal was opened with a drill, irrigated to try to prevent fat emboli.  An   intramedullary rod was passed at 3 degrees valgus, 9 mm of bone was   resected off the distal femur.  Following this resection, the tibia was   subluxated anteriorly.  Using the extramedullary guide, 2 mm of bone was resected off   the proximal medial tibia.  We confirmed the gap would be   stable medially and laterally with a size 5 spacer block as well as confirmed that the tibial cut was perpendicular in the coronal plane, checking with an alignment rod.      Once this was done, I sized the femur to be a size 3  in the anterior-   posterior dimension, chose a standard component based on medial and   lateral dimension.  The size 3 rotation block was then pinned in   position anterior referenced using the C-clamp to set rotation.  The   anterior, posterior, and  chamfer cuts were made without difficulty nor   notching making certain that I was along the anterior cortex to help   with flexion gap stability.      The final box cut was made off the lateral aspect of distal femur.      At this point, the tibia was sized to be a size 4.  The size 4 tray was   then pinned in position through the medial third of the tubercle,   drilled, and keel punched.  Trial reduction was now carried with a 3 femur,  4 tibia, a size 5 mm CR MS insert, and the 32 anatomic patella botton.  The knee was brought to full extension with good flexion stability with the patella   tracking through the trochlea without application of pressure.  Given   all these findings the trial components removed.  Final components were   opened and cement was mixed.  The knee was irrigated with normal saline solution and pulse lavage.  The synovial lining was   then injected with 30 cc of 0.25% Marcaine  with epinephrine , 1 cc of Toradol  and 30 cc of NS for a total of 61 cc.     Final implants were then cemented onto cleaned and dried cut surfaces of bone with the knee brought to extension with a size 5 mm CR MS trial insert.      Once the cement had fully cured, excess cement was removed   throughout the knee.  I confirmed that I was satisfied with the range of   motion and stability, and the final size 5 mm CR MS AOX insert was chosen.  It was   placed into the knee.      The tourniquet had been let down at 35 minutes.  No significant   hemostasis was required.  The extensor mechanism was then reapproximated using #1 Vicryl and #1 Stratafix sutures  with the knee   in flexion.  The   remaining wound was closed with 2-0 Vicryl and running  4-0 Monocryl.   The knee was cleaned, dried, dressed sterilely using Dermabond and   Aquacel dressing.  The patient was then   brought to recovery room in stable condition, tolerating the procedure   well.          Azalea Lento Bernard Brick, M.D.    02/13/2024 9:07 AM

## 2024-02-13 NOTE — Progress Notes (Signed)
 Physical Therapy Treatment Patient Details Name: [name] MRN: [id number] DOB: October 09, 1971 Today's Date: 02/13/2024   History of Present Illness Pt s/p R TKR    PT Comments  Pt reports marked improvement in sensation and up to ambulate in hall, negotiated stairs and reviewed written HEP.  Pt eager for dc home this date.   If plan is discharge home, recommend the following: A little help with walking and/or transfers;A little help with bathing/dressing/bathroom;Assistance with cooking/housework;Assist for transportation;Help with stairs or ramp for entrance   Can travel by private vehicle        Equipment Recommendations  None recommended by PT    Recommendations for Other Services       Precautions / Restrictions Precautions Precautions: Fall;Knee Restrictions Weight Bearing Restrictions Per Provider Order: No Other Position/Activity Restrictions: WBAT     Mobility  Bed Mobility Overal bed mobility: Needs Assistance Bed Mobility: Supine to Sit     Supine to sit: Contact guard     General bed mobility comments: for safety    Transfers Overall transfer level: Needs assistance Equipment used: Rolling walker (2 wheels) Transfers: Sit to/from Stand Sit to Stand: Contact guard assist           General transfer comment: cues for LE management and use of UEs to self assist    Ambulation/Gait Ambulation/Gait assistance: Min assist, Contact guard assist Gait Distance (Feet): 86 Feet Assistive device: Rolling walker (2 wheels) Gait Pattern/deviations: Step-to pattern, Decreased step length - right, Decreased step length - left, Shuffle, Trunk flexed Gait velocity: decr     General Gait Details: Increased time with cues for sequence, posture and position from RW   Stairs Stairs: Yes Stairs assistance: Min assist Stair Management: One rail Left, Step to pattern, Forwards, With cane Number of Stairs: 3 General stair comments: Steady assist with cues for  sequence and foot/cane placement   Wheelchair Mobility     Tilt Bed    Modified Rankin (Stroke Patients Only)       Balance Overall balance assessment: Needs assistance Sitting-balance support: No upper extremity supported, Feet supported Sitting balance-Leahy Scale: Good     Standing balance support: Bilateral upper extremity supported Standing balance-Leahy Scale: Poor                              Communication Communication Communication: No apparent difficulties  Cognition Arousal: Alert Behavior During Therapy: WFL for tasks assessed/performed   PT - Cognitive impairments: No apparent impairments                         Following commands: Intact      Cueing    Exercises Total Joint Exercises Ankle Circles/Pumps: AROM, Both, 15 reps, Supine Quad Sets: AROM, Both, 10 reps, Supine Heel Slides: AAROM, Right, 10 reps, Supine Straight Leg Raises: AAROM, AROM, Right, 10 reps, Supine    General Comments        Pertinent Vitals/Pain Pain Assessment Pain Assessment: 0-10 Pain Score: 3  Pain Location: R knee Pain Descriptors / Indicators: Aching, Sore Pain Intervention(s): Limited activity within patient's tolerance, Monitored during session, Premedicated before session    Home Living Family/patient expects to be discharged to:: Private residence Living Arrangements: Alone Available Help at Discharge: Family;Available 24 hours/day Type of Home: House Home Access: Stairs to enter Entrance Stairs-Rails: Right Entrance Stairs-Number of Steps: 2   Home Layout: One level Home Equipment:  Rolling Walker (2 wheels);Cane - single point;Shower seat;BSC/3in1      Prior Function            PT Goals (current goals can now be found in the care plan section) Acute Rehab PT Goals Patient Stated Goal: Regain IND PT Goal Formulation: With patient Time For Goal Achievement: 02/20/24 Potential to Achieve Goals: Good Progress towards PT goals:  Progressing toward goals    Frequency    7X/week      PT Plan      Co-evaluation              AM-PAC PT "6 Clicks" Mobility   Outcome Measure  Help needed turning from your back to your side while in a flat bed without using bedrails?: A Little Help needed moving from lying on your back to sitting on the side of a flat bed without using bedrails?: A Little Help needed moving to and from a bed to a chair (including a wheelchair)?: A Little Help needed standing up from a chair using your arms (e.g., wheelchair or bedside chair)?: A Little Help needed to walk in hospital room?: A Little Help needed climbing 3-5 steps with a railing? : A Little 6 Click Score: 18    End of Session Equipment Utilized During Treatment: Gait belt Activity Tolerance: Patient tolerated treatment well Patient left: in chair;with call bell/phone within reach;with family/visitor present Nurse Communication: Mobility status PT Visit Diagnosis: Difficulty in walking, not elsewhere classified (R26.2);Pain Pain - Right/Left: Right Pain - part of body: Knee     Time: 1351-1415 PT Time Calculation (min) (ACUTE ONLY): 24 min  Charges:    $Gait Training: 8-22 mins PT General Charges $$ ACUTE PT VISIT: 1 Visit                     Thedora Finlay PT Acute Rehabilitation Services Pager [phone] Office [phone]    Wynne Jury 02/13/2024, 2:23 PM

## 2024-02-13 NOTE — Interval H&P Note (Signed)
 History and Physical Interval Note:  02/13/2024 7:16 AM  [name]  has presented today for surgery, with the diagnosis of Right knee osteoarthritis.  The various methods of treatment have been discussed with the patient and family. After consideration of risks, benefits and other options for treatment, the patient has consented to  Procedure(s): ARTHROPLASTY, KNEE, TOTAL (Right) as a surgical intervention.  The patient's history has been reviewed, patient examined, no change in status, stable for surgery.  I have reviewed the patient's chart and labs.  Questions were answered to the patient's satisfaction.     Bevin Bucks

## 2024-02-14 ENCOUNTER — Encounter (HOSPITAL_COMMUNITY): Payer: Self-pay | Admitting: Orthopedic Surgery

## 2024-03-11 ENCOUNTER — Other Ambulatory Visit: Payer: Self-pay | Admitting: Physician Assistant

## 2024-03-11 DIAGNOSIS — N63 Unspecified lump in unspecified breast: Secondary | ICD-10-CM

## 2024-03-18 ENCOUNTER — Ambulatory Visit: Admitting: Gastroenterology

## 2024-03-26 ENCOUNTER — Ambulatory Visit

## 2024-03-26 ENCOUNTER — Ambulatory Visit
Admission: RE | Admit: 2024-03-26 | Discharge: 2024-03-26 | Disposition: A | Discharge status: Home / Self Care | Source: Ambulatory Visit | Attending: Physician Assistant | Admitting: Physician Assistant

## 2024-03-26 DIAGNOSIS — N63 Unspecified lump in unspecified breast: Secondary | ICD-10-CM

## 2024-08-24 ENCOUNTER — Other Ambulatory Visit: Payer: Self-pay | Admitting: Family Medicine

## 2024-08-24 DIAGNOSIS — R918 Other nonspecific abnormal finding of lung field: Secondary | ICD-10-CM

## 2024-09-04 ENCOUNTER — Ambulatory Visit
Admission: RE | Admit: 2024-09-04 | Discharge: 2024-09-04 | Disposition: A | Discharge status: Home / Self Care | Source: Ambulatory Visit | Attending: Family Medicine | Admitting: Family Medicine

## 2024-09-04 DIAGNOSIS — R918 Other nonspecific abnormal finding of lung field: Secondary | ICD-10-CM

## 2024-11-04 ENCOUNTER — Other Ambulatory Visit: Payer: Self-pay | Admitting: Family Medicine

## 2024-11-04 DIAGNOSIS — Z1231 Encounter for screening mammogram for malignant neoplasm of breast: Secondary | ICD-10-CM

## 2024-11-30 ENCOUNTER — Ambulatory Visit
